# Patient Record
Sex: Female | Born: 1985 | Hispanic: No | Marital: Married | Smoking: Former smoker
Health system: Southern US, Community
[De-identification: ages and names within clinical notes are randomized; demographics above are authoritative.]

## PROBLEM LIST (undated history)

## (undated) DIAGNOSIS — J45909 Unspecified asthma, uncomplicated: Secondary | ICD-10-CM

## (undated) DIAGNOSIS — D573 Sickle-cell trait: Secondary | ICD-10-CM

## (undated) DIAGNOSIS — E079 Disorder of thyroid, unspecified: Secondary | ICD-10-CM

## (undated) DIAGNOSIS — M4800 Spinal stenosis, site unspecified: Secondary | ICD-10-CM

## (undated) DIAGNOSIS — E039 Hypothyroidism, unspecified: Secondary | ICD-10-CM

## (undated) DIAGNOSIS — Z8742 Personal history of other diseases of the female genital tract: Secondary | ICD-10-CM

## (undated) HISTORY — DX: Spinal stenosis, site unspecified: M48.00

## (undated) HISTORY — PX: NO PAST SURGERIES: SHX2092

## (undated) HISTORY — DX: Disorder of thyroid, unspecified: E07.9

## (undated) HISTORY — DX: Unspecified asthma, uncomplicated: J45.909

## (undated) HISTORY — DX: Sickle-cell trait: D57.3

## (undated) HISTORY — DX: Personal history of other diseases of the female genital tract: Z87.42

## (undated) HISTORY — DX: Hypothyroidism, unspecified: E03.9

---
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
## 2019-06-08 DIAGNOSIS — M5431 Sciatica, right side: Secondary | ICD-10-CM | POA: Insufficient documentation

## 2020-09-08 ENCOUNTER — Other Ambulatory Visit: Payer: Self-pay

## 2020-09-08 DIAGNOSIS — Z20822 Contact with and (suspected) exposure to covid-19: Secondary | ICD-10-CM

## 2020-09-10 LAB — NOVEL CORONAVIRUS, NAA: SARS-CoV-2, NAA: NOT DETECTED

## 2020-09-10 LAB — SARS-COV-2, NAA 2 DAY TAT

## 2020-11-16 ENCOUNTER — Ambulatory Visit: Payer: Self-pay | Admitting: Family Medicine

## 2020-11-21 ENCOUNTER — Ambulatory Visit: Payer: Medicaid Other | Admitting: Nurse Practitioner

## 2020-11-23 ENCOUNTER — Other Ambulatory Visit: Payer: Self-pay

## 2020-11-23 ENCOUNTER — Ambulatory Visit: Payer: Self-pay | Admitting: Nurse Practitioner

## 2020-11-23 ENCOUNTER — Ambulatory Visit (INDEPENDENT_AMBULATORY_CARE_PROVIDER_SITE_OTHER): Payer: Medicaid Other | Admitting: Nurse Practitioner

## 2020-11-23 ENCOUNTER — Encounter: Payer: Self-pay | Admitting: Nurse Practitioner

## 2020-11-23 VITALS — BP 122/72 | HR 81 | Temp 98.1°F | Ht 64.0 in | Wt 199.6 lb

## 2020-11-23 DIAGNOSIS — Z6834 Body mass index (BMI) 34.0-34.9, adult: Secondary | ICD-10-CM | POA: Diagnosis not present

## 2020-11-23 DIAGNOSIS — E039 Hypothyroidism, unspecified: Secondary | ICD-10-CM | POA: Diagnosis not present

## 2020-11-23 DIAGNOSIS — Z7689 Persons encountering health services in other specified circumstances: Secondary | ICD-10-CM | POA: Diagnosis not present

## 2020-11-23 DIAGNOSIS — E6609 Other obesity due to excess calories: Secondary | ICD-10-CM | POA: Diagnosis not present

## 2020-11-23 NOTE — Patient Instructions (Signed)
Hypothyroidism  Hypothyroidism is when the thyroid gland does not make enough of certain hormones (it is underactive). The thyroid gland is a small gland located in the lower front part of the neck, just in front of the windpipe (trachea). This gland makes hormones that help control how the body uses food for energy (metabolism) as well as how the heart and brain function. These hormones also play a role in keeping your bones strong. When the thyroid is underactive, it produces too little of the hormones thyroxine (T4) and triiodothyronine (T3). What are the causes? This condition may be caused by:  Hashimoto's disease. This is a disease in which the body's disease-fighting system (immune system) attacks the thyroid gland. This is the most common cause.  Viral infections.  Pregnancy.  Certain medicines.  Birth defects.  Past radiation treatments to the head or neck for cancer.  Past treatment with radioactive iodine.  Past exposure to radiation in the environment.  Past surgical removal of part or all of the thyroid.  Problems with a gland in the center of the brain (pituitary gland).  Lack of enough iodine in the diet. What increases the risk? You are more likely to develop this condition if:  You are female.  You have a family history of thyroid conditions.  You use a medicine called lithium.  You take medicines that affect the immune system (immunosuppressants). What are the signs or symptoms? Symptoms of this condition include:  Feeling as though you have no energy (lethargy).  Not being able to tolerate cold.  Weight gain that is not explained by a change in diet or exercise habits.  Lack of appetite.  Dry skin.  Coarse hair.  Menstrual irregularity.  Slowing of thought processes.  Constipation.  Sadness or depression. How is this diagnosed? This condition may be diagnosed based on:  Your symptoms, your medical history, and a physical exam.  Blood  tests. You may also have imaging tests, such as an ultrasound or MRI. How is this treated? This condition is treated with medicine that replaces the thyroid hormones that your body does not make. After you begin treatment, it may take several weeks for symptoms to go away. Follow these instructions at home:  Take over-the-counter and prescription medicines only as told by your health care provider.  If you start taking any new medicines, tell your health care provider.  Keep all follow-up visits as told by your health care provider. This is important. ? As your condition improves, your dosage of thyroid hormone medicine may change. ? You will need to have blood tests regularly so that your health care provider can monitor your condition. Contact a health care provider if:  Your symptoms do not get better with treatment.  You are taking thyroid hormone replacement medicine and you: ? Sweat a lot. ? Have tremors. ? Feel anxious. ? Lose weight rapidly. ? Cannot tolerate heat. ? Have emotional swings. ? Have diarrhea. ? Feel weak. Get help right away if you have:  Chest pain.  An irregular heartbeat.  A rapid heartbeat.  Difficulty breathing. Summary  Hypothyroidism is when the thyroid gland does not make enough of certain hormones (it is underactive).  When the thyroid is underactive, it produces too little of the hormones thyroxine (T4) and triiodothyronine (T3).  The most common cause is Hashimoto's disease, a disease in which the body's disease-fighting system (immune system) attacks the thyroid gland. The condition can also be caused by viral infections, medicine, pregnancy, or   past radiation treatment to the head or neck.  Symptoms may include weight gain, dry skin, constipation, feeling as though you do not have energy, and not being able to tolerate cold.  This condition is treated with medicine to replace the thyroid hormones that your body does not make. This  information is not intended to replace advice given to you by your health care provider. Make sure you discuss any questions you have with your health care provider. Document Revised: 05/20/2020 Document Reviewed: 05/05/2020 Elsevier Patient Education  2021 Elsevier Inc.  

## 2020-11-23 NOTE — Progress Notes (Signed)
This visit occurred during the SARS-CoV-2 public health emergency.  Safety protocols were in place, including screening questions prior to the visit, additional usage of staff PPE, and extensive cleaning of exam room while observing appropriate contact time as indicated for disinfecting solutions.  Subjective:     Patient ID: Terri Bright , female    DOB: 17-Aug-1986 , 35 y.o.   MRN: [id number]   Chief Complaint  Patient presents with  . Establish Care    HPI  Patient is here to establish care. She just moved from [place].  She was diagnosed with thyroid when she was 18. The medication made her feel sluggish. 2016 she was diagnosed with Hashimoto disease. She hasn't been on that medication since she had her son. She drinks wine occasionally. Smoke: No. She does not have a OBGYN specialist. She is not working currently.   She will return for a physical exam.     Past Medical History:  Diagnosis Date  . Asthma   . Sickle cell trait (HCC)   . Spinal stenosis   . Thyroid disease      Family History  Problem Relation Age of Onset  . Hypertension Mother   . Diabetes Mother   . Hypothyroidism Mother   . Seizures Father   . Diabetes Mellitus I Paternal Grandmother   . Hyperlipidemia Paternal Grandfather   . Diabetes Mellitus I Paternal Grandfather     No current outpatient medications on file.   Allergies  Allergen Reactions  . Bactrim [Sulfamethoxazole-Trimethoprim]      Review of Systems  Constitutional: Negative.  Negative for chills and fever.  HENT: Negative for ear pain.   Respiratory: Negative.  Negative for choking, shortness of breath and wheezing.   Cardiovascular: Negative.  Negative for chest pain and palpitations.  Gastrointestinal: Negative.  Negative for constipation and diarrhea.  Endocrine: Positive for cold intolerance. Negative for heat intolerance, polydipsia, polyphagia and polyuria.       History of thyroid disease   Musculoskeletal: Negative for  arthralgias and myalgias.  Neurological: Negative.  Negative for dizziness and numbness.     Today's Vitals   11/23/20 1455  BP: 122/72  Pulse: 81  Temp: 98.1 F (36.7 C)  TempSrc: Oral  Weight: 199 lb 9.6 oz (90.5 kg)  Height: 5\' 4"  (1.626 m)  PainSc: 10-Worst pain ever  PainLoc: Back   Body mass index is 34.26 kg/m.   Objective:  Physical Exam Constitutional:      Appearance: Normal appearance. She is obese.  HENT:     Head: Normocephalic and atraumatic.  Neck:     Thyroid: No thyroid mass or thyroid tenderness.  Cardiovascular:     Rate and Rhythm: Normal rate and regular rhythm.     Pulses: Normal pulses.     Heart sounds: Normal heart sounds. No murmur heard.   Pulmonary:     Effort: Pulmonary effort is normal. No respiratory distress.     Breath sounds: Normal breath sounds. No wheezing.  Musculoskeletal:     Cervical back: Normal range of motion and neck supple. No rigidity or tenderness.  Lymphadenopathy:     Cervical: No cervical adenopathy.  Skin:    General: Skin is warm and dry.     Capillary Refill: Capillary refill takes less than 2 seconds.  Neurological:     Mental Status: She is alert and oriented to person, place, and time.  Psychiatric:        Mood and Affect: Mood normal.  Behavior: Behavior normal.        Thought Content: Thought content normal.        Judgment: Judgment normal.         Assessment And Plan:     1. Encounter to establish care -Patient is here to establish care. We went over and discussed her medical, surgical and medication history. Discussed with the patient the importance of her having regular screenings, immunization and multivitamins. Patient will come back for a annual physical exam at where we will do a paps smear.   2. Hypothyroidism, unspecified type -Patient has history of hypothyroidism -will check and evaluate   - TSH + free T4  3. Class 1 obesity due to excess calories without serious comorbidity  with body mass index (BMI) of 34.0 to 34.9 in adult  -Educated patient about the importance of healthy diet and exercise. Patient should maintain a healthy diet consisting of [place] vegetables and fruits. Avoid fast food and intake of red meats. Hydrate with water and exercise for atleast 30-45 min. Daily .   Patient was given opportunity to ask questions. Patient verbalized understanding of the plan and was able to repeat key elements of the plan. All questions were answered to their satisfaction.  Bethany Cumming DNP    I, [employer], have reviewed all documentation for this visit. The documentation onfor the exam, diagnosis, procedures, and orders are all accurate and complete. 11/22/2020  IF YOU HAVE BEEN REFE0/RRED TO A SPECIALIST, IT MAY TAKE 1-2 WEEKS TO SCHEDULE/PROCESS THE REFERRAL. IF YOU HAVE NOT HEARD/ FROM US/SPECIALIST IN TWO WEEKS, PLEASE GIVE [place] A CALL AT [phone].   THE PATIENT IS ENCOURAGED TO PRACTICE SOCIAL DISTANCING DUE TO THE COVID-19 PANDEMIC.

## 2020-11-24 ENCOUNTER — Other Ambulatory Visit: Payer: Self-pay | Admitting: Nurse Practitioner

## 2020-11-24 DIAGNOSIS — E039 Hypothyroidism, unspecified: Secondary | ICD-10-CM

## 2020-11-24 DIAGNOSIS — G894 Chronic pain syndrome: Secondary | ICD-10-CM | POA: Diagnosis not present

## 2020-11-24 DIAGNOSIS — M5459 Other low back pain: Secondary | ICD-10-CM | POA: Diagnosis not present

## 2020-11-24 DIAGNOSIS — Z79899 Other long term (current) drug therapy: Secondary | ICD-10-CM | POA: Diagnosis not present

## 2020-11-24 DIAGNOSIS — M4726 Other spondylosis with radiculopathy, lumbar region: Secondary | ICD-10-CM | POA: Diagnosis not present

## 2020-11-24 DIAGNOSIS — Z79891 Long term (current) use of opiate analgesic: Secondary | ICD-10-CM | POA: Diagnosis not present

## 2020-11-24 LAB — TSH+FREE T4
Free T4: 0.94 ng/dL (ref 0.82–1.77)
TSH: 13 u[IU]/mL — ABNORMAL HIGH (ref 0.450–4.500)

## 2020-11-24 MED ORDER — LEVOTHYROXINE SODIUM 25 MCG PO TABS: 25.0000 ug | ORAL_TABLET | Freq: Every day | ORAL | 1 refills | Status: DC

## 2020-12-07 DIAGNOSIS — M4727 Other spondylosis with radiculopathy, lumbosacral region: Secondary | ICD-10-CM | POA: Diagnosis not present

## 2020-12-29 ENCOUNTER — Other Ambulatory Visit: Payer: Self-pay | Admitting: Nurse Practitioner

## 2020-12-29 DIAGNOSIS — E039 Hypothyroidism, unspecified: Secondary | ICD-10-CM

## 2021-02-23 ENCOUNTER — Encounter: Payer: Medicaid Other | Admitting: Nurse Practitioner

## 2021-02-28 ENCOUNTER — Other Ambulatory Visit (HOSPITAL_COMMUNITY)
Admission: RE | Admit: 2021-02-28 | Discharge: 2021-02-28 | Disposition: A | Discharge status: Home / Self Care | Payer: 59 | Source: Ambulatory Visit | Attending: Nurse Practitioner | Admitting: Nurse Practitioner

## 2021-02-28 ENCOUNTER — Encounter: Payer: Self-pay | Admitting: Nurse Practitioner

## 2021-02-28 ENCOUNTER — Other Ambulatory Visit: Payer: Self-pay

## 2021-02-28 ENCOUNTER — Ambulatory Visit (INDEPENDENT_AMBULATORY_CARE_PROVIDER_SITE_OTHER): Payer: 59 | Admitting: Nurse Practitioner

## 2021-02-28 VITALS — BP 112/68 | HR 70 | Temp 98.1°F | Ht 63.6 in | Wt 185.4 lb

## 2021-02-28 DIAGNOSIS — Z0001 Encounter for general adult medical examination with abnormal findings: Secondary | ICD-10-CM

## 2021-02-28 DIAGNOSIS — Z202 Contact with and (suspected) exposure to infections with a predominantly sexual mode of transmission: Secondary | ICD-10-CM | POA: Insufficient documentation

## 2021-02-28 DIAGNOSIS — Z0000 Encounter for general adult medical examination without abnormal findings: Secondary | ICD-10-CM | POA: Diagnosis not present

## 2021-02-28 DIAGNOSIS — E039 Hypothyroidism, unspecified: Secondary | ICD-10-CM

## 2021-02-28 NOTE — Patient Instructions (Signed)
Health Maintenance, Female Adopting a healthy lifestyle and getting preventive care are important in promoting health and wellness. Ask your health care provider about: The right schedule for you to have regular tests and exams. Things you can do on your own to prevent diseases and keep yourself healthy. What should I know about diet, weight, and exercise? Eat a healthy diet  Eat a diet that includes plenty of vegetables, fruits, low-fat dairy products, and lean protein. Do not eat a lot of foods that are high in solid fats, added sugars, or sodium.  Maintain a healthy weight Body mass index (BMI) is used to identify weight problems. It estimates body fat based on height and weight. Your health care provider can help determineyour BMI and help you achieve or maintain a healthy weight. Get regular exercise Get regular exercise. This is one of the most important things you can do for your health. Most adults should: Exercise for at least 150 minutes each week. The exercise should increase your heart rate and make you sweat (moderate-intensity exercise). Do strengthening exercises at least twice a week. This is in addition to the moderate-intensity exercise. Spend less time sitting. Even light physical activity can be beneficial. Watch cholesterol and blood lipids Have your blood tested for lipids and cholesterol at 33 years of age, then havethis test every 5 years. Have your cholesterol levels checked more often if: Your lipid or cholesterol levels are high. You are older than 33 years of age. You are at high risk for heart disease. What should I know about cancer screening? Depending on your health history and family history, you may need to have cancer screening at various ages. This may include screening for: Breast cancer. Cervical cancer. Colorectal cancer. Skin cancer. Lung cancer. What should I know about heart disease, diabetes, and high blood pressure? Blood pressure and heart  disease High blood pressure causes heart disease and increases the risk of stroke. This is more likely to develop in people who have high blood pressure readings, are of African descent, or are overweight. Have your blood pressure checked: Every 3-5 years if you are 18-39 years of age. Every year if you are 40 years old or older. Diabetes Have regular diabetes screenings. This checks your fasting blood sugar level. Have the screening done: Once every three years after age 40 if you are at a normal weight and have a low risk for diabetes. More often and at a younger age if you are overweight or have a high risk for diabetes. What should I know about preventing infection? Hepatitis B If you have a higher risk for hepatitis B, you should be screened for this virus. Talk with your health care provider to find out if you are at risk forhepatitis B infection. Hepatitis C Testing is recommended for: Everyone born from 1945 through 1965. Anyone with known risk factors for hepatitis C. Sexually transmitted infections (STIs) Get screened for STIs, including gonorrhea and chlamydia, if: You are sexually active and are younger than 33 years of age. You are older than 33 years of age and your health care provider tells you that you are at risk for this type of infection. Your sexual activity has changed since you were last screened, and you are at increased risk for chlamydia or gonorrhea. Ask your health care provider if you are at risk. Ask your health care provider about whether you are at high risk for HIV. Your health care provider may recommend a prescription medicine to help   prevent HIV infection. If you choose to take medicine to prevent HIV, you should first get tested for HIV. You should then be tested every 3 months for as long as you are taking the medicine. Pregnancy If you are about to stop having your period (premenopausal) and you may become pregnant, seek counseling before you get  pregnant. Take 400 to 800 micrograms (mcg) of folic acid every day if you become pregnant. Ask for birth control (contraception) if you want to prevent pregnancy. Osteoporosis and menopause Osteoporosis is a disease in which the bones lose minerals and strength with aging. This can result in bone fractures. If you are 65 years old or older, or if you are at risk for osteoporosis and fractures, ask your health care provider if you should: Be screened for bone loss. Take a calcium or vitamin D supplement to lower your risk of fractures. Be given hormone replacement therapy (HRT) to treat symptoms of menopause. Follow these instructions at home: Lifestyle Do not use any products that contain nicotine or tobacco, such as cigarettes, e-cigarettes, and chewing tobacco. If you need help quitting, ask your health care provider. Do not use street drugs. Do not share needles. Ask your health care provider for help if you need support or information about quitting drugs. Alcohol use Do not drink alcohol if: Your health care provider tells you not to drink. You are pregnant, may be pregnant, or are planning to become pregnant. If you drink alcohol: Limit how much you use to 0-1 drink a day. Limit intake if you are breastfeeding. Be aware of how much alcohol is in your drink. In the [place] one drink equals one 12 oz bottle of beer (355 mL), one 5 oz glass of wine (148 mL), or one 1 oz glass of hard liquor (44 mL). General instructions Schedule regular health, dental, and eye exams. Stay current with your vaccines. Tell your health care provider if: You often feel depressed. You have ever been abused or do not feel safe at home. Summary Adopting a healthy lifestyle and getting preventive care are important in promoting health and wellness. Follow your health care provider's instructions about healthy diet, exercising, and getting tested or screened for diseases. Follow your health care provider's  instructions on monitoring your cholesterol and blood pressure. This information is not intended to replace advice given to you by your health care provider. Make sure you discuss any questions you have with your healthcare provider. Document Revised: 08/13/2018 Document Reviewed: 08/13/2018 Elsevier Patient Education  2022 Elsevier Inc.  

## 2021-02-28 NOTE — Progress Notes (Signed)
I,Tianna Badgett,acting as a Education administrator for [employer], NP.,have documented all relevant documentation on the behalf of [employer], NP,as directed by  Bary Castilla, NP while in the presence of Bary Castilla, NP.  This visit occurred during the SARS-CoV-2 public health emergency.  Safety protocols were in place, including screening questions prior to the visit, additional usage of staff PPE, and extensive cleaning of exam room while observing appropriate contact time as indicated for disinfecting solutions.  Subjective:     Patient ID: Terri Bright , female    DOB: 19-Apr-1986 , 35 y.o.   MRN: [id number]   Chief Complaint  Patient presents with   Annual Exam    HPI  Patient is here for physical exam.  Diet: breakfast majority of the time. She eats pretty healthy. Lots of vegetables. Roasted chicken with corn. Potatoes and carrots. Drinks a lot of water.  Exercise: She stays active at work.  Drink: Occasionally  Smoke: no  Sexually active and has one child.     Past Medical History:  Diagnosis Date   Asthma    Sickle cell trait ([place])    Spinal stenosis    Thyroid disease      Family History  Problem Relation Age of Onset   Hypertension Mother    Diabetes Mother    Hypothyroidism Mother    Seizures Father    Diabetes Mellitus I Paternal Grandmother    Hyperlipidemia Paternal Grandfather    Diabetes Mellitus I Paternal Grandfather      Current Outpatient Medications:    levothyroxine (SYNTHROID) 25 MCG tablet, TAKE 1 TABLET(25 MCG) BY MOUTH DAILY BEFORE BREAKFAST, Disp: 30 tablet, Rfl: 1   Allergies  Allergen Reactions   Bactrim [Sulfamethoxazole-Trimethoprim]       The patient states she uses none for birth control. Last LMP was No LMP recorded.. Negative for Dysmenorrhea. Negative for: breast discharge, breast lump(s), breast pain and breast self exam. Associated symptoms include abnormal vaginal bleeding. Pertinent negatives include abnormal  bleeding (hematology), anxiety, decreased libido, depression, difficulty falling sleep, dyspareunia, history of infertility, nocturia, sexual dysfunction, sleep disturbances, urinary incontinence, urinary urgency, vaginal discharge and vaginal itching. Diet regular.The patient states her exercise level is    . The patient's tobacco use is:  Social History   Tobacco Use  Smoking Status Former   Pack years: 0.00  Smokeless Tobacco Never  . She has been exposed to passive smoke. The patient's alcohol use is:  Social History   Substance and Sexual Activity  Alcohol Use Yes   Comment: ocassionally  . Additional information: Last pap  next one scheduled for patient currently on her period and would like to go see a OBGYN  Review of Systems  Constitutional: Negative.  Negative for chills, fatigue and fever.  HENT: Negative.  Negative for congestion, rhinorrhea, sinus pressure, sinus pain and sneezing.   Eyes: Negative.   Respiratory: Negative.  Negative for cough, chest tightness, shortness of breath and wheezing.   Cardiovascular: Negative.  Negative for chest pain and palpitations.  Gastrointestinal: Negative.   Endocrine: Negative.   Genitourinary: Negative.  Negative for dysuria.  Musculoskeletal: Negative.  Negative for arthralgias.  Skin: Negative.   Allergic/Immunologic: Negative.   Neurological: Negative.  Negative for dizziness, weakness, numbness and headaches.  Hematological: Negative.   Psychiatric/Behavioral: Negative.      Today's Vitals   02/28/21 1502  BP: 112/68  Pulse: 70  Temp: 98.1 F (36.7 C)  TempSrc: Oral  Weight: 185 lb 6.4 oz (84.1  kg)  Height: 5' 3.6" (1.615 m)   Body mass index is 32.23 kg/m.   Objective:  Physical Exam Vitals and nursing note reviewed.  Constitutional:      Appearance: Normal appearance.  HENT:     Head: Normocephalic and atraumatic.     Right Ear: Tympanic membrane, ear canal and external ear normal.     Left Ear: Tympanic  membrane, ear canal and external ear normal.     Nose: Nose normal.     Mouth/Throat:     Mouth: Mucous membranes are moist.     Pharynx: Oropharynx is clear.  Eyes:     Extraocular Movements: Extraocular movements intact.     Conjunctiva/sclera: Conjunctivae normal.     Pupils: Pupils are equal, round, and reactive to light.  Cardiovascular:     Rate and Rhythm: Normal rate and regular rhythm.     Pulses: Normal pulses.     Heart sounds: Normal heart sounds. No murmur heard. Pulmonary:     Effort: Pulmonary effort is normal. No respiratory distress.     Breath sounds: Normal breath sounds. No wheezing.  Abdominal:     General: Abdomen is flat. Bowel sounds are normal.     Palpations: Abdomen is soft.  Genitourinary:    Comments: Deferred. Patient refused. She is on menstruation.  Musculoskeletal:        General: Normal range of motion.     Cervical back: Normal range of motion and neck supple.  Skin:    General: Skin is warm and dry.     Capillary Refill: Capillary refill takes less than 2 seconds.  Neurological:     General: No focal deficit present.     Mental Status: She is alert and oriented to person, place, and time.  Psychiatric:        Mood and Affect: Mood normal.        Behavior: Behavior normal.        Assessment And Plan:     1. Encounter for annual physical exam --Patient is here for their annual physical exam and we discussed any changes to medication and medical history.  -Behavior modification was discussed as well as diet and exercise history  -Patient will continue to exercise regularly and modify their diet.  -Recommendation for yearly physical annuals, immunization and screenings including mammogram and colonoscopy were discussed with the patient.  -Recommended intake of multivitamin, vitamin D and calcium.  -Individualized advise was given to the patient pertaining to their own health history in regards to diet, exercise, medical condition and  referrals.  - CBC - Hemoglobin A1c - CMP14+EGFR - Lipid panel - Hepatitis C antibody - HIV Antibody (routine testing w rflx)  2. Hypothyroidism, unspecified type -Patient currently taking med.  -Will check and assess and change med if needed.  - TSH + free T4  3. Possible exposure to STD -possible exposure to STD and will do STD testing  - STD Panel - Urine cytology ancillary only - RPR - Ambulatory referral to Obstetrics / Gynecology  The patient was encouraged to call or send a message through [place] for any questions or concerns.   Follow up: if symptoms persist or do not get better.   Side effects and appropriate use of all the medication(s) were discussed with the patient today. Patient advised to use the medication(s) as directed by their healthcare provider. The patient was encouraged to read, review, and understand all associated package inserts and contact our office with any questions  or concerns. The patient accepts the risks of the treatment plan and had an opportunity to ask questions.   Patient was given opportunity to ask questions. Patient verbalized understanding of the plan and was able to repeat key elements of the plan. All questions were answered to their satisfaction.  Raman Shemeca Lukasik, DNP   I, Raman Quantina Dershem have reviewed all documentation for this visit. The documentation on 02/28/21 for the exam, diagnosis, procedures, and orders are all accurate and complete.   THE PATIENT IS ENCOURAGED TO PRACTICE SOCIAL DISTANCING DUE TO THE COVID-19 PANDEMIC.

## 2021-03-02 LAB — RPR+HSVIGM+HBSAG+HSV2(IGG)+...
HIV Screen 4th Generation wRfx: NONREACTIVE
HSV 2 IgG, Type Spec: <0.91 index (ref 0.00–0.90)
HSVI/II Comb IgM: 1.95 Ratio — ABNORMAL HIGH (ref 0.00–0.90)
Hepatitis B Surface Ag: NEGATIVE
RPR Ser Ql: NONREACTIVE

## 2021-03-02 LAB — CMP14+EGFR
ALT: 50 IU/L — ABNORMAL HIGH (ref 0–32)
AST: 27 IU/L (ref 0–40)
Albumin/Globulin Ratio: 1.5 (ref 1.2–2.2)
Albumin: 4.8 g/dL (ref 3.8–4.8)
Alkaline Phosphatase: 72 IU/L (ref 44–121)
BUN/Creatinine Ratio: 19 (ref 9–23)
BUN: 12 mg/dL (ref 6–20)
Bilirubin Total: 0.6 mg/dL (ref 0.0–1.2)
CO2: 19 mmol/L — ABNORMAL LOW (ref 20–29)
Calcium: 9.8 mg/dL (ref 8.7–10.2)
Chloride: 103 mmol/L (ref 96–106)
Creatinine, Ser: 0.64 mg/dL (ref 0.57–1.00)
Globulin, Total: 3.2 g/dL (ref 1.5–4.5)
Glucose: 84 mg/dL (ref 65–99)
Potassium: 4.3 mmol/L (ref 3.5–5.2)
Sodium: 141 mmol/L (ref 134–144)
Total Protein: 8 g/dL (ref 6.0–8.5)
eGFR: 120 mL/min/{1.73_m2} (ref >59)

## 2021-03-02 LAB — CBC
Hematocrit: 36.8 % (ref 34.0–46.6)
Hemoglobin: 12.4 g/dL (ref 11.1–15.9)
MCH: 25.4 pg — ABNORMAL LOW (ref 26.6–33.0)
MCHC: 33.7 g/dL (ref 31.5–35.7)
MCV: 75 fL — ABNORMAL LOW (ref 79–97)
Platelets: 397 10*3/uL (ref 150–450)
RBC: 4.88 x10E6/uL (ref 3.77–5.28)
RDW: 12.5 % (ref 11.7–15.4)
WBC: 6 10*3/uL (ref 3.4–10.8)

## 2021-03-02 LAB — HEPATITIS C ANTIBODY: Hep C Virus Ab: <0.1 s/co ratio (ref 0.0–0.9)

## 2021-03-02 LAB — URINE CYTOLOGY ANCILLARY ONLY
Bacterial Vaginitis-Urine: NEGATIVE
Candida Urine: NEGATIVE
Chlamydia: NEGATIVE
Comment: NEGATIVE
Comment: NEGATIVE
Comment: NORMAL
Neisseria Gonorrhea: NEGATIVE
Trichomonas: NEGATIVE

## 2021-03-02 LAB — TSH+FREE T4
Free T4: 1.19 ng/dL (ref 0.82–1.77)
TSH: 8.54 u[IU]/mL — ABNORMAL HIGH (ref 0.450–4.500)

## 2021-03-02 LAB — LIPID PANEL
Chol/HDL Ratio: 7.1 ratio — ABNORMAL HIGH (ref 0.0–4.4)
Cholesterol, Total: 235 mg/dL — ABNORMAL HIGH (ref 100–199)
HDL: 33 mg/dL — ABNORMAL LOW (ref >39)
LDL Chol Calc (NIH): 167 mg/dL — ABNORMAL HIGH (ref 0–99)
Triglycerides: 187 mg/dL — ABNORMAL HIGH (ref 0–149)
VLDL Cholesterol Cal: 35 mg/dL (ref 5–40)

## 2021-03-02 LAB — HEMOGLOBIN A1C
Est. average glucose Bld gHb Est-mCnc: 108 mg/dL
Hgb A1c MFr Bld: 5.4 % (ref 4.8–5.6)

## 2021-03-07 ENCOUNTER — Other Ambulatory Visit: Payer: Self-pay | Admitting: Nurse Practitioner

## 2021-03-07 DIAGNOSIS — B009 Herpesviral infection, unspecified: Secondary | ICD-10-CM

## 2021-03-07 DIAGNOSIS — E039 Hypothyroidism, unspecified: Secondary | ICD-10-CM

## 2021-03-07 MED ORDER — VALACYCLOVIR HCL 500 MG PO TABS: 500.0000 mg | ORAL_TABLET | Freq: Two times a day (BID) | ORAL | 0 refills | Status: AC

## 2021-03-07 MED ORDER — LEVOTHYROXINE SODIUM 50 MCG PO TABS: 50.0000 ug | ORAL_TABLET | Freq: Every day | ORAL | 2 refills | Status: DC

## 2021-04-04 ENCOUNTER — Ambulatory Visit: Payer: Medicaid Other | Admitting: Nurse Practitioner

## 2021-04-11 ENCOUNTER — Ambulatory Visit: Payer: Medicaid Other | Admitting: Nurse Practitioner

## 2021-04-18 ENCOUNTER — Other Ambulatory Visit: Payer: Medicaid Other

## 2021-04-24 ENCOUNTER — Other Ambulatory Visit: Payer: Self-pay

## 2021-04-24 ENCOUNTER — Ambulatory Visit (INDEPENDENT_AMBULATORY_CARE_PROVIDER_SITE_OTHER): Payer: 59 | Admitting: Advanced Practice Midwife

## 2021-04-24 ENCOUNTER — Encounter: Payer: Self-pay | Admitting: Advanced Practice Midwife

## 2021-04-24 VITALS — BP 118/76 | HR 83 | Wt 190.0 lb

## 2021-04-24 DIAGNOSIS — E063 Autoimmune thyroiditis: Secondary | ICD-10-CM | POA: Insufficient documentation

## 2021-04-24 DIAGNOSIS — N939 Abnormal uterine and vaginal bleeding, unspecified: Secondary | ICD-10-CM | POA: Insufficient documentation

## 2021-04-24 DIAGNOSIS — Z3169 Encounter for other general counseling and advice on procreation: Secondary | ICD-10-CM | POA: Diagnosis not present

## 2021-04-24 DIAGNOSIS — Z01419 Encounter for gynecological examination (general) (routine) without abnormal findings: Secondary | ICD-10-CM | POA: Diagnosis not present

## 2021-04-24 NOTE — Progress Notes (Signed)
Subjective:     Terri Bright is a 35 y.o. female here at [facility] for a routine exam.  Current complaints: none, irregular menses but this is pt baseline. She had regular periods for 1 year or so after her son but has been irregular otherwise since menarche.  She desires another pregnancy. She used Femara, an ovulation stimulating medication, to become pregnant with her 33 year old and desires to try this again.  She has Hashimotos Disease and has on and off taken medications for this. She reports significant side effects with her recent restart of thyroid medication so she stopped taking it.  Personal health questionnaire reviewed: yes.    Do you have a primary care provider? yes Do you feel safe at home? yes  Flowsheet Row Office Visit from 11/23/2020 in [facility]  PHQ-2 Total Score 0       Health Maintenance Due  Topic Date Due   PAP SMEAR-Modifier  Never done   COVID-19 Vaccine (3 - Booster for Moderna series) 06/03/2020   INFLUENZA VACCINE  04/03/2021     Risk factors for chronic health problems: Smoking: Former smoker Alchohol/how much: occasional Pt BMI: Body mass index is 33.02 kg/m.   Gynecologic History Patient's last menstrual period was 04/14/2021. Contraception: none Last Pap: 2021. Results were: normal with neg HPV per pt, records requested. Last mammogram: n/a. Results were: normal  Obstetric History OB History  Gravida Para Term Preterm AB Living  1 1 1         SAB IAB Ectopic Multiple Live Births               # Outcome Date GA Lbr Len/2nd Weight Sex Delivery Anes PTL Lv  1 Term 01/05/16       N      The following portions of the patient's history were reviewed and updated as appropriate: allergies, current medications, past family history, past medical history, past social history, past surgical history, and problem list.  Review of Systems Pertinent items noted in HPI and remainder of comprehensive ROS otherwise negative.     Objective:   BP 118/76   Pulse 83   Wt 190 lb (86.2 kg)   LMP 04/14/2021   BMI 33.02 kg/m  VS reviewed, nursing note reviewed,  Constitutional: well developed, well nourished, no distress HEENT: normocephalic CV: normal rate Pulm/chest wall: normal effort Breast Exam:  Deferred with low risks and shared decision making, discussed recommendation to start mammogram between 40-50 yo/ Abdomen: soft Neuro: alert and oriented x 3 Skin: warm, dry Psych: affect normal Pelvic exam: Deferred      Assessment/Plan:  1. Well woman exam with routine gynecological exam --Pap 1 year ago wnl with neg HPV per pt --Records requested  2. Encounter for preconception consultation --Reviewed healthy lifestyle and diet, pt is eating healthy/low carb currently --Pt was able to become pregnant in 3 months with ovulation stimulating med (Femara) with her 33 year old and desires to try this again  3. Abnormal uterine bleeding (AUB) --Irregular menses since menarche, oligomenorrhea when younger, now menses 2 times per month --C/W PCOS, no other reported symptoms but GDM in previous pregnancy --Discussed options including OCPs/hormonal tx for bleeding. Pt declines. She used Femara for ovulation and became pregnant in 3 months with her 33 year old and desires to try this again.      Follow up in: 1 month with MD to discuss fertility options then 1 year for annual exam or  as needed.   Sharen Counter, CNM 2:05 PM

## 2021-05-02 ENCOUNTER — Ambulatory Visit: Payer: Medicaid Other | Admitting: Nurse Practitioner

## 2021-05-22 ENCOUNTER — Encounter: Payer: Self-pay | Admitting: Obstetrics and Gynecology

## 2021-05-22 ENCOUNTER — Ambulatory Visit (INDEPENDENT_AMBULATORY_CARE_PROVIDER_SITE_OTHER): Payer: 59 | Admitting: Obstetrics and Gynecology

## 2021-05-22 ENCOUNTER — Other Ambulatory Visit: Payer: Self-pay

## 2021-05-22 ENCOUNTER — Ambulatory Visit: Payer: Medicaid Other | Admitting: Advanced Practice Midwife

## 2021-05-22 ENCOUNTER — Other Ambulatory Visit (HOSPITAL_COMMUNITY)
Admission: RE | Admit: 2021-05-22 | Discharge: 2021-05-22 | Disposition: A | Discharge status: Home / Self Care | Payer: 59 | Source: Ambulatory Visit | Attending: Advanced Practice Midwife | Admitting: Advanced Practice Midwife

## 2021-05-22 VITALS — BP 131/79 | HR 86

## 2021-05-22 DIAGNOSIS — N939 Abnormal uterine and vaginal bleeding, unspecified: Secondary | ICD-10-CM | POA: Diagnosis not present

## 2021-05-22 NOTE — Progress Notes (Signed)
35 yo P1 presenting today for the evaluation of abnormal uterine bleeding. Patient reports onset of twice monthly 6-7 day cycle since February. Patient states she is having a predictable period every 2 weeks. She denies any pelvic pain or abnormal discharge. Patient states she was diagnosed with PCOS and admits to a history of oligomenorrhea. She states she has never had a cycle this frequently. Patient is also interested in conceiving and required assistance with Femara. Patient desires Femara again  Past Medical History:  Diagnosis Date   Asthma    Hypothyroidism    Sickle cell trait (HCC)    Spinal stenosis    Thyroid disease    History reviewed. No pertinent surgical history. Family History  Problem Relation Age of Onset   Hypertension Mother    Diabetes Mother    Hypothyroidism Mother    Seizures Father    Diabetes Mellitus I Paternal Grandmother    Hyperlipidemia Paternal Grandfather    Diabetes Mellitus I Paternal Grandfather    Social History   Tobacco Use   Smoking status: Former   Smokeless tobacco: Never  Substance Use Topics   Alcohol use: Yes    Comment: ocassionally   Drug use: Never   ROS See pertinent in HPI. All other systems reviewed and non contributory  Blood pressure 131/79, pulse 86, last menstrual period 05/07/2021. GENERAL: Well-developed, well-nourished female in no acute distress.  ABDOMEN: Soft, nontender, nondistended. No organomegaly. PELVIC: Normal external female genitalia. Vagina is pink and rugated.  Normal discharge. Normal appearing cervix. Uterus is normal in size. No adnexal mass or tenderness. Chaperone present during the pelvic exam EXTREMITIES: No cyanosis, clubbing, or edema, 2+ distal pulses.  A/P 36 yo with PCOS and abnormal uterine bleeding - Pelvic ultrasound ordered - Discussed benefits of endometrial biopsy ENDOMETRIAL BIOPSY     The indications for endometrial biopsy were reviewed.   Risks of the biopsy including cramping,  bleeding, infection, uterine perforation, inadequate specimen and need for additional procedures  were discussed. The patient states she understands and agrees to undergo procedure today. Consent was signed. Time out was performed. Urine HCG was negative. A sterile speculum was placed in the patient's vagina and the cervix was prepped with Betadine. A single-toothed tenaculum was placed on the anterior lip of the cervix to stabilize it. The uterine cavity was sounded to a depth of 8 cm using the uterine sound. The 3 mm pipelle was introduced into the endometrial cavity without difficulty, 2passes were made.  A  moderate amount of tissue was  sent to pathology. The instruments were removed from the patient's vagina. Minimal bleeding from the cervix was noted. The patient tolerated the procedure well.  Routine post-procedure instructions were given to the patient. The patient will follow up in two weeks to review the results and for further management.  - Rx Femara and provera pending completion of the evaluation for AUB

## 2021-05-23 LAB — SURGICAL PATHOLOGY

## 2021-05-30 ENCOUNTER — Ambulatory Visit: Payer: Medicaid Other

## 2021-06-07 ENCOUNTER — Ambulatory Visit: Admission: RE | Admit: 2021-06-07 | Payer: 59 | Source: Ambulatory Visit

## 2021-06-12 ENCOUNTER — Ambulatory Visit (HOSPITAL_BASED_OUTPATIENT_CLINIC_OR_DEPARTMENT_OTHER): Payer: 59

## 2021-06-13 ENCOUNTER — Ambulatory Visit (HOSPITAL_BASED_OUTPATIENT_CLINIC_OR_DEPARTMENT_OTHER)
Admission: RE | Admit: 2021-06-13 | Discharge: 2021-06-13 | Disposition: A | Discharge status: Home / Self Care | Payer: 59 | Source: Ambulatory Visit | Attending: Obstetrics and Gynecology | Admitting: Obstetrics and Gynecology

## 2021-06-13 ENCOUNTER — Other Ambulatory Visit: Payer: Self-pay

## 2021-06-13 DIAGNOSIS — N939 Abnormal uterine and vaginal bleeding, unspecified: Secondary | ICD-10-CM | POA: Diagnosis not present

## 2021-06-14 ENCOUNTER — Other Ambulatory Visit: Payer: Self-pay | Admitting: Obstetrics and Gynecology

## 2021-06-14 MED ORDER — LETROZOLE 2.5 MG PO TABS: 2.5000 mg | ORAL_TABLET | Freq: Every day | ORAL | 2 refills | Status: DC

## 2021-06-14 MED ORDER — MEDROXYPROGESTERONE ACETATE 10 MG PO TABS
10.0000 mg | ORAL_TABLET | Freq: Every day | ORAL | 2 refills | Status: DC
Start: 2021-06-14 — End: 2021-11-03

## 2021-06-17 ENCOUNTER — Telehealth: Payer: Self-pay | Admitting: Advanced Practice Midwife

## 2021-06-19 NOTE — Telephone Encounter (Signed)
Message sent to pt to obtain Pap records from previous provider.

## 2021-08-16 ENCOUNTER — Ambulatory Visit: Payer: Medicaid Other | Admitting: Nurse Practitioner

## 2021-11-03 ENCOUNTER — Telehealth: Payer: Medicaid Other | Admitting: Physician Assistant

## 2021-11-03 DIAGNOSIS — B349 Viral infection, unspecified: Secondary | ICD-10-CM | POA: Diagnosis not present

## 2021-11-03 DIAGNOSIS — R6889 Other general symptoms and signs: Secondary | ICD-10-CM

## 2021-11-03 DIAGNOSIS — R197 Diarrhea, unspecified: Secondary | ICD-10-CM

## 2021-11-03 NOTE — Progress Notes (Signed)
Based on what you shared with me, I feel your condition warrants further evaluation and I recommend that you be seen in a face to face visit. ? ?Giving note of constant pain in chest or abdomen you will require an in-person evaluation for detailed examination and workup so you can get the proper treatment. Please be seen at local Urgent Care if you PCP is unable to evaluate you today.  ?  ?NOTE: There will be NO CHARGE for this eVisit ?  ?If you are having a true medical emergency please call 911.   ?  ? For an urgent face to face visit, [facility] has six urgent care centers for your convenience:  ?  ? [facility] ?Get Driving Directions ?[phone] ?[phone] ?[place], [place] [address] ?  ? [facility]) ?Get Driving Directions ?[phone] ?[address] ?[place], [place] [address] ? ?[facility]) ?Get Driving Directions ?[phone] ?[address] ?[place],  [place]  [address] ? ?[facility] ?Get Driving Directions ?[phone] ?[phone] ?[place], [place] [address] ?  ?[facility] [place] ?Get Driving Directions  ?[phone] ?[address] ?[address] ?[place], [place] [address] ?  ?[facility] ?Get Driving Directions ?[phone] ?[address] ?[place], [place] [address] ? ?Your MyChart E-visit questionnaire answers were reviewed by a board certified advanced clinical practitioner to complete your personal care plan based on your specific symptoms.  Thank you for using e-Visits. ?  ? ?

## 2021-11-03 NOTE — Patient Instructions (Addendum)
?  Terri Bright, thank you for joining [name], PA-C for today's virtual visit.  While this provider is not your primary care provider (PCP), if your PCP is located in our provider database this encounter information will be shared with them immediately following your visit. ? ?Consent: ?(Patient) [name] provided verbal consent for this virtual visit at the beginning of the encounter. ? ?Current Medications: ?No current outpatient medications on file.  ? ?Medications ordered in this encounter:  ?No orders of the defined types were placed in this encounter. ?  ? ?*If you need refills on other medications prior to your next appointment, please contact your pharmacy* ? ?Follow-Up: ?Call back or seek an in-person evaluation if the symptoms worsen or if the condition fails to improve as anticipated. ? ?Other Instructions ?Please keep well-hydrated and get plenty of rest. ?Try to follow the BRAT diet below. Start a daily probiotic. ?Try to get some low-sugar electrolyte water in your system like G2 gatorade or Pedialyte. ?Use the zofran as directed for nausea. You can use Imodium OTC as well. ? ?If not resolving or any new or worsening symptoms despite treatment, you need to seek an in-person evaluation as discussed.  ? ? ?If you have been instructed to have an in-person evaluation today at a local Urgent Care facility, please use the link below. It will take you to a list of all of our available [facility], including address, phone number and hours of operation. Please do not delay care.  ?[facility] ? ?If you or a family member do not have a primary care provider, use the link below to schedule a visit and establish care. When you choose a [facility] primary care physician or advanced practice provider, you gain a long-term partner in health. ?Find a Primary Care Provider ? ?Learn more about [facility]'s in-office and virtual care options: ?[facility] Now  ?

## 2021-11-03 NOTE — Progress Notes (Signed)
?Virtual Visit Consent  ? ?Terri Bright, you are scheduled for a virtual visit with a [facility] provider today.   ?  ?Just as with appointments in the office, your consent must be obtained to participate.  Your consent will be active for this visit and any virtual visit you may have with one of our providers in the next 365 days.   ?  ?If you have a MyChart account, a copy of this consent can be sent to you electronically.  All virtual visits are billed to your insurance company just like a traditional visit in the office.   ? ?As this is a virtual visit, video technology does not allow for your provider to perform a traditional examination.  This may limit your provider's ability to fully assess your condition.  If your provider identifies any concerns that need to be evaluated in person or the need to arrange testing (such as labs, EKG, etc.), we will make arrangements to do so.   ?  ?Although advances in technology are sophisticated, we cannot ensure that it will always work on either your end or our end.  If the connection with a video visit is poor, the visit may have to be switched to a telephone visit.  With either a video or telephone visit, we are not always able to ensure that we have a secure connection.    ? ?I need to obtain your verbal consent now.   Are you willing to proceed with your visit today?  ?  ?Terri Bright has provided verbal consent on 11/03/2021 for a virtual visit (video or telephone). ?  ?Piedad Climes, PA-C  ? ?Date: 11/03/2021 2:19 PM ? ? ?Virtual Visit via Video Note  ? ?IPiedad Climes, connected with  Terri Bright  ([id number], 1985-12-07) on 11/03/21 at  2:15 PM EST by a video-enabled telemedicine application and verified that I am speaking with the correct person using two identifiers. ? ?Location: ?Patient: Virtual Visit Location Patient: Home ?Provider: Virtual Visit Location Provider: Home Office ?  ?I discussed the limitations of evaluation and management by  telemedicine and the availability of in person appointments. The patient expressed understanding and agreed to proceed.   ? ?History of Present Illness: ?Terri Bright is a 36 y.o. who identifies as a female who was assigned female at birth, and is being seen today for symptoms starting Monday night. Notes initially with sore throat. The next morning noting worsened sore throat and development of some mild aches and chills Notes she gargled with salt water for sore throat. As of yesterday noting some loose stool with urgency and episodes of nausea and vomiting. Last episode of emesis was last night. Noting low-grade fever until today and has resolved. Still with diarrhea and headache. Denies recent travel. Took a home COVID test which was negative.  ? ? ?HPI: HPI  ?Problems:  ?Patient Active Problem List  ? Diagnosis Date Noted  ? Hashimoto's disease 04/24/2021  ? Abnormal uterine bleeding (AUB) 04/24/2021  ?  ?Allergies:  ?Allergies  ?Allergen Reactions  ? Bactrim [Sulfamethoxazole-Trimethoprim]   ? ?Medications: No current outpatient medications on file. ? ?Observations/Objective: ?Patient is well-developed, well-nourished in no acute distress.  ?Resting comfortably at home.  ?Head is normocephalic, atraumatic.  ?No labored breathing. ?Speech is clear and coherent with logical content.  ?Patient is alert and oriented at baseline.  ? ?Assessment and Plan: ?1. Viral illness ? ?Question initial flu-like illness versus norovirus. URI symptoms have mostly resolved as  well as fever. Still with nausea and diarrhea. Emesis stopped as of last night. Will continue to push PO fluids. Rx Zofran. Imodium OTC as needed. BRAT diet. Strict UC/ER precautions reviewed.  ? ?Follow Up Instructions: ?I discussed the assessment and treatment plan with the patient. The patient was provided an opportunity to ask questions and all were answered. The patient agreed with the plan and demonstrated an understanding of the instructions.  A copy  of instructions were sent to the patient via MyChart unless otherwise noted below.  ? ?The patient was advised to call back or seek an in-person evaluation if the symptoms worsen or if the condition fails to improve as anticipated. ? ?Time:  ?I spent 12 minutes with the patient via telehealth technology discussing the above problems/concerns.   ? ?Piedad Climes, PA-C ?

## 2022-02-20 ENCOUNTER — Ambulatory Visit (INDEPENDENT_AMBULATORY_CARE_PROVIDER_SITE_OTHER): Payer: Medicaid Other

## 2022-02-20 VITALS — BP 119/81 | HR 80 | Ht 64.0 in | Wt 192.0 lb

## 2022-02-20 DIAGNOSIS — Z3201 Encounter for pregnancy test, result positive: Secondary | ICD-10-CM

## 2022-02-20 DIAGNOSIS — O0990 Supervision of high risk pregnancy, unspecified, unspecified trimester: Secondary | ICD-10-CM

## 2022-02-20 LAB — POCT URINE PREGNANCY: Preg Test, Ur: POSITIVE — AB

## 2022-02-20 NOTE — Progress Notes (Addendum)
Ms. Eber presents today for UPT. She has no unusual complaints.  [id number]    OBJECTIVE: Appears well, in no apparent distress.  OB History     Gravida  2   Para  1   Term  1   Preterm      AB      Living  1      SAB      IAB      Ectopic      Multiple      Live Births             Home UPT Result: POSITIVE X3 In-Office UPT result: POSITIVE  I have reviewed the patient's medical, obstetrical, social, and family histories, and medications.   ASSESSMENT: Positive pregnancy test LMP 01/11/2022 EDD 10/18/2022 [place]    [redacted]w[redacted]d  PLAN Prenatal care to be completed at: [facility]

## 2022-03-07 ENCOUNTER — Encounter: Payer: Medicaid Other | Admitting: Nurse Practitioner

## 2022-03-12 ENCOUNTER — Telehealth: Payer: Self-pay

## 2022-03-19 ENCOUNTER — Inpatient Hospital Stay (HOSPITAL_COMMUNITY): Payer: Medicaid Other

## 2022-03-19 ENCOUNTER — Inpatient Hospital Stay (HOSPITAL_COMMUNITY)
Admission: AD | Admit: 2022-03-19 | Discharge: 2022-03-19 | Disposition: A | Discharge status: Home / Self Care | Payer: Medicaid Other | Attending: Obstetrics & Gynecology | Admitting: Obstetrics & Gynecology

## 2022-03-19 ENCOUNTER — Ambulatory Visit (INDEPENDENT_AMBULATORY_CARE_PROVIDER_SITE_OTHER): Payer: Medicaid Other

## 2022-03-19 ENCOUNTER — Ambulatory Visit (INDEPENDENT_AMBULATORY_CARE_PROVIDER_SITE_OTHER): Payer: Medicaid Other | Admitting: Medical

## 2022-03-19 VITALS — BP 119/76 | HR 91 | Ht 64.0 in | Wt 195.2 lb

## 2022-03-19 DIAGNOSIS — O021 Missed abortion: Secondary | ICD-10-CM | POA: Diagnosis not present

## 2022-03-19 DIAGNOSIS — R109 Unspecified abdominal pain: Secondary | ICD-10-CM | POA: Diagnosis not present

## 2022-03-19 DIAGNOSIS — O0990 Supervision of high risk pregnancy, unspecified, unspecified trimester: Secondary | ICD-10-CM

## 2022-03-19 DIAGNOSIS — O0289 Other abnormal products of conception: Secondary | ICD-10-CM

## 2022-03-19 DIAGNOSIS — O3680X0 Pregnancy with inconclusive fetal viability, not applicable or unspecified: Secondary | ICD-10-CM

## 2022-03-19 DIAGNOSIS — O26891 Other specified pregnancy related conditions, first trimester: Secondary | ICD-10-CM | POA: Insufficient documentation

## 2022-03-19 DIAGNOSIS — Z3A09 9 weeks gestation of pregnancy: Secondary | ICD-10-CM | POA: Insufficient documentation

## 2022-03-19 LAB — CBC
HCT: 36.3 % (ref 36.0–46.0)
Hemoglobin: 12.4 g/dL (ref 12.0–15.0)
MCH: 26.6 pg (ref 26.0–34.0)
MCHC: 34.2 g/dL (ref 30.0–36.0)
MCV: 77.7 fL — ABNORMAL LOW (ref 80.0–100.0)
Platelets: 305 10*3/uL (ref 150–400)
RBC: 4.67 MIL/uL (ref 3.87–5.11)
RDW: 13.4 % (ref 11.5–15.5)
WBC: 8.6 10*3/uL (ref 4.0–10.5)
nRBC: 0 % (ref 0.0–0.2)

## 2022-03-19 LAB — ABO/RH: ABO/RH(D): O POS

## 2022-03-19 LAB — HCG, QUANTITATIVE, PREGNANCY: hCG, Beta Chain, Quant, S: 43946 m[IU]/mL — ABNORMAL HIGH (ref <5)

## 2022-03-19 MED ORDER — BLOOD PRESSURE KIT DEVI: 1.0000 | 0 refills | Status: DC

## 2022-03-19 NOTE — MAU Note (Signed)
..  Terri Bright is a 36 y.o. at [redacted]w[redacted]d here in MAU reporting: Abdominal cramping that started at 6pm. Denies vaginal bleeding.  Reports she was at [facility] earlier today and was told baby is measuring smaller than 9 weeks and they did not find a heart beat.   Pain score: 3/10 Vitals:   03/19/22 2035  BP: 130/74  Pulse: 91  Resp: 17  Temp: 98.2 F (36.8 C)  SpO2: 100%

## 2022-03-19 NOTE — Progress Notes (Signed)
New OB Intake  I connected with  Terri Bright on 03/19/22 at  3:10 PM EDT by in person and verified that I am speaking with the correct person using two identifiers. Nurse is located at [facility] and pt is located at [place].  I discussed the limitations, risks, security and privacy concerns of performing an evaluation and management service by telephone and the availability of in person appointments. I also discussed with the patient that there may be a patient responsible charge related to this service. The patient expressed understanding and agreed to proceed.  I explained I am completing New OB Intake today. We discussed that patient will be scheduled for a formal scan to reassess for viability. Provider discussed plan and options with patient. Pt is G3/P1011. I reviewed her allergies, medications, Medical/Surgical/OB history, and appropriate screenings. I informed her of [facility] services. [facility] information placed in AVS. Based on history, this is a/an  pregnancy uncomplicated .   Patient Active Problem List   Diagnosis Date Noted   Supervision of high risk pregnancy, antepartum 02/20/2022   Hashimoto's disease 04/24/2021   Abnormal uterine bleeding (AUB) 04/24/2021    Concerns addressed today  Delivery Plans Plans to deliver at [facility]. Patient given information for [facility] website for more information about [facility]. Patient is not interested in water birth. Offered upcoming OB visit with CNM to discuss further.  MyChart/Babyscripts MyChart access verified. I explained pt will have some visits in office and some virtually. Babyscripts instructions given and order placed. Patient verifies receipt of registration text/e-mail. Account successfully created and app downloaded.  Blood Pressure Cuff/Weight Scale Blood pressure cuff ordered for patient to pick-up from [employer]. Explained after first prenatal appt pt will check weekly and document in  Babyscripts. Patient does have weight scale.  Anatomy US Explained first scheduled [place] will be around 19 weeks. Dating and viability scan performed today. Anatomy scan to be scheduled at a later date.  Labs Discussed Avelina Laine genetic screening with patient. Would like both Panorama and Horizon drawn at new OB visit. Routine prenatal labs needed.  Covid Vaccine Patient has covid vaccine.   Is patient a CenteringPregnancy candidate?  Declined Declined due to Group Setting Not a candidate due to  Centering Patient" indicated on sticky note  Social Determinants of Health Food Insecurity: Patient denies food insecurity. WIC Referral: Patient is interested in referral to [facility].  Transportation: Patient denies transportation needs. Childcare: Discussed no children allowed at ultrasound appointments. Offered childcare services; patient declines childcare services at this time.  First visit review I reviewed new OB appt with pt. I explained she will have a provider visit that includes prenatal labs, std testing, pap, and discuss plan for pregnancy. Explained pt will be seen by Nettie Elm at first visit; encounter routed to appropriate provider. Explained that patient will be seen by pregnancy navigator following visit with provider.   Terri Capri, RN 03/19/2022  3:34 PM

## 2022-03-19 NOTE — MAU Provider Note (Signed)
History     CSN: [id number]  Arrival date and time: 03/19/22 1910   Event Date/Time   First Provider Initiated Contact with Patient 03/19/22 2123      Chief Complaint  Patient presents with   Abdominal Pain   HPI Terri Bright is a 36 y.o. G3P1011 at [redacted]w[redacted]d who presents to MAU with chief complaint of abdominal cramping. This is a new problem, onset following patient's transvaginal ultrasound around 4pm today. Patient reports she was told her baby did not have a heartbeat and she was given options for next steps. She is worried that her abdominal pain is the beginning of a spontaneous miscarriage. Pain score on arrival to MAU is 3/10. She denies aggravating or alleviating factors. She has not taken medication for this complaint. She denies vaginal bleeding, dysuria, fever or recent illness.  She has initiated care with [facility].  OB History     Gravida  3   Para  1   Term  1   Preterm      AB  1   Living  1      SAB  1   IAB      Ectopic      Multiple      Live Births  1           Past Medical History:  Diagnosis Date   Asthma    History of PCOS    Hypothyroidism    Sickle cell trait ([place])    Spinal stenosis    Thyroid disease     No past surgical history on file.  Family History  Problem Relation Age of Onset   Hypertension Mother    Diabetes Mother    Hypothyroidism Mother    Seizures Father    Diabetes Mellitus I Paternal Grandmother    Hyperlipidemia Paternal Grandfather    Diabetes Mellitus I Paternal Grandfather     Social History   Tobacco Use   Smoking status: Former    Types: Cigarettes    Quit date: 02/2020    Years since quitting: 2.1   Smokeless tobacco: Never  Vaping Use   Vaping Use: Never used  Substance Use Topics   Alcohol use: Not Currently    Comment: ocassionally, prior to pregnancy   Drug use: Never    Allergies:  Allergies  Allergen Reactions   Bactrim [Sulfamethoxazole-Trimethoprim]     Medications Prior  to Admission  Medication Sig Dispense Refill Last Dose   Blood Pressure Monitoring (BLOOD PRESSURE KIT) DEVI 1 kit by Does not apply route once a week. 1 each 0    Prenatal MV & Min w/FA-DHA (PRENATAL GUMMIES PO) Take 2 tablets by mouth daily.       Review of Systems  Gastrointestinal:  Positive for abdominal pain.  All other systems reviewed and are negative.  Physical Exam   Blood pressure 130/74, pulse 91, temperature 98.2 F (36.8 C), temperature source Oral, resp. rate 17, height $RemoveBe'5\' 4"'vBlhOHPIc$  (1.626 m), weight 88.2 kg, last menstrual period 01/11/2022, SpO2 100 %.  Physical Exam Vitals and nursing note reviewed. Exam conducted with a chaperone present.  Constitutional:      Appearance: She is well-developed.  Cardiovascular:     Rate and Rhythm: Normal rate.     Heart sounds: Normal heart sounds.  Pulmonary:     Effort: Pulmonary effort is normal.  Abdominal:     General: Abdomen is flat.  Skin:    General: Skin is warm.  Capillary Refill: Capillary refill takes less than 2 seconds.  Neurological:     Mental Status: She is alert and oriented to person, place, and time.  Psychiatric:        Mood and Affect: Mood normal.        Behavior: Behavior normal.     MAU Course  Procedures  MDM  --Ultrasound performed in office today, no final report yet --Discussed with MAU sonographer, will need to reaccomplish due to resolution of images  Patient Vitals for the past 24 hrs:  BP Temp Temp src Pulse Resp SpO2 Height Weight  03/19/22 2035 130/74 98.2 F (36.8 C) Oral 91 17 100 % $Rem'5\' 4"'LiFr$  (1.626 m) 88.2 kg   Orders Placed This Encounter  Procedures   US OB LESS THAN 14 WEEKS WITH OB TRANSVAGINAL   CBC   hCG, quantitative, pregnancy   ABO/Rh   Discharge patient   Patient Vitals for the past 24 hrs:  BP Temp Temp src Pulse Resp SpO2 Height Weight  03/19/22 2035 130/74 98.2 F (36.8 C) Oral 91 17 100 % $Rem'5\' 4"'bmQQ$  (1.626 m) 88.2 kg   Results for orders placed or performed  during the hospital encounter of 03/19/22 (from the past 24 hour(s))  CBC     Status: Abnormal   Collection Time: 03/19/22  8:34 PM  Result Value Ref Range   WBC 8.6 4.0 - 10.5 K/uL   RBC 4.67 3.87 - 5.11 MIL/uL   Hemoglobin 12.4 12.0 - 15.0 g/dL   HCT 36.3 36.0 - 46.0 %   MCV 77.7 (L) 80.0 - 100.0 fL   MCH 26.6 26.0 - 34.0 pg   MCHC 34.2 30.0 - 36.0 g/dL   RDW 13.4 11.5 - 15.5 %   Platelets 305 150 - 400 K/uL   nRBC 0.0 0.0 - 0.2 %  hCG, quantitative, pregnancy     Status: Abnormal   Collection Time: 03/19/22  8:34 PM  Result Value Ref Range   hCG, Beta Chain, Quant, S 43,946 (H) <5 mIU/mL  ABO/Rh     Status: None   Collection Time: 03/19/22  8:34 PM  Result Value Ref Range   ABO/RH(D) O POS    No rh immune globuloin      NOT A RH IMMUNE GLOBULIN CANDIDATE, PT RH POSITIVE Performed at  [facility], [place] [address], [place], [place] [address]    US OB LESS THAN 14 WEEKS WITH [place] TRANSVAGINAL  Result Date: 03/19/2022 CLINICAL DATA:  Follow-up for viability. EXAM: OBSTETRIC <14 WK ULTRASOUND TECHNIQUE: Transabdominal ultrasound was performed for evaluation of the gestation as well as the maternal uterus and adnexal regions. COMPARISON:  11/17/2021. FINDINGS: Intrauterine gestational sac: Single Yolk sac:  Yes Embryo:  Yes Cardiac Activity: No Heart Rate: None CRL:   22.7 mm   9 w 0 d                  [place] EDC: 10/22/2022. Subchorionic hemorrhage:  None visualized. Maternal uterus/adnexae: The ovaries are within normal limits. No free fluid in the pelvis. IMPRESSION: Single intrauterine gestational sac containing a yolk sac and fetal pole with estimated gestational age of [redacted] weeks 0 days. No fetal cardiac activity or heart rate is seen at this time, concerning for failed early pregnancy. Electronically Signed   By: Brett Fairy M.D.   On: 03/19/2022 21:13       Assessment and Plan  --36 y.o. G3P1011 measuring 9w 0d --Findings consistent with failed pregnancy per  above  criteria --Patient elects expectant management --Bleeding precautions reviewed --Discharge home in stable condition  F/U: --At the end of this afternoon's outpatient appointment she was scheduled for repeat US 07/24 --Patient states that her hx of being a fertility patient causes her to want to keep this appointment  Darlina Rumpf, [place], MSN, CNM 03/19/2022, 12:21 AM

## 2022-03-20 NOTE — Progress Notes (Unsigned)
Tysheena Ginzburg presents today for new OB intake. Based on sure LMP she should be [redacted]w[redacted]d today. She has PCOS and was about to start [facility] when she spontaneously conceived. She denies pain or bleeding today. She had RN intake and US performed and I was asked to review [place] results with the patient. [place] was re-performed in my presence. SIUP noted without FHR at ~ [redacted] weeks gestation.   Patient was notified that with current CRL, FHR should be present and likely was present and has now stopped which will result in loss of the pregnancy. The patient was reassured that there is nothing that she could have done to cause the demise nor could we have prevented it with earlier appointments or intervention.   Patient was given the options of formal US with MFM at [facility] prior to discussing options for management and opted to schedule follow-up US at this time.   Patient was advised that it is safe to wait for management as long as she does not develop fever. Warning signs for changes in condition warranting evaluation were discussed including fever, heavy vaginal bleeding or severe abdominal pain. Patient was advised of when to call the office and when to present to MAU. Options for management were briefly discussed including expectant management, Cytotec or surgery. Patient will await formal US results.   Patient was visibly upset, but otherwise stable. This was a desired pregnancy. We will follow-up after next [place] as indicated.   Vonzella Nipple, PA-C 03/20/2022 12:51 PM

## 2022-03-26 ENCOUNTER — Ambulatory Visit
Admission: RE | Admit: 2022-03-26 | Discharge: 2022-03-26 | Disposition: A | Discharge status: Home / Self Care | Payer: Medicaid Other | Source: Ambulatory Visit | Attending: Medical | Admitting: Medical

## 2022-03-26 ENCOUNTER — Other Ambulatory Visit: Payer: Self-pay | Admitting: Medical

## 2022-03-26 ENCOUNTER — Ambulatory Visit (INDEPENDENT_AMBULATORY_CARE_PROVIDER_SITE_OTHER): Payer: Medicaid Other | Admitting: Obstetrics and Gynecology

## 2022-03-26 ENCOUNTER — Encounter: Payer: Self-pay | Admitting: Family Medicine

## 2022-03-26 ENCOUNTER — Encounter: Payer: Self-pay | Admitting: Obstetrics and Gynecology

## 2022-03-26 VITALS — BP 108/77 | HR 80 | Ht 64.0 in

## 2022-03-26 DIAGNOSIS — O021 Missed abortion: Secondary | ICD-10-CM | POA: Diagnosis not present

## 2022-03-26 DIAGNOSIS — O3680X0 Pregnancy with inconclusive fetal viability, not applicable or unspecified: Secondary | ICD-10-CM | POA: Insufficient documentation

## 2022-03-26 DIAGNOSIS — Z3A08 8 weeks gestation of pregnancy: Secondary | ICD-10-CM | POA: Diagnosis not present

## 2022-03-26 MED ORDER — ONDANSETRON 4 MG PO TBDP: 4.0000 mg | ORAL_TABLET | Freq: Four times a day (QID) | ORAL | 0 refills | Status: DC | PRN

## 2022-03-26 MED ORDER — OXYCODONE HCL 5 MG PO TABS: 5.0000 mg | ORAL_TABLET | ORAL | 0 refills | Status: DC | PRN

## 2022-03-26 MED ORDER — MISOPROSTOL 200 MCG PO TABS: 800.0000 ug | ORAL_TABLET | Freq: Once | ORAL | 1 refills | Status: DC

## 2022-03-26 NOTE — Progress Notes (Signed)
Pt sent from [place] dept to receive results of ultrasound today. Pt reports no bleeding and has only minimal back pain/cramping in her sides. Ultrasound results confirm failed pregnancy - reviewed by Dr. Para March. Pt desires to know options for care @ this time.

## 2022-03-26 NOTE — Progress Notes (Signed)
GYNECOLOGY OFFICE VISIT NOTE  History:   Terri Bright is a 36 y.o. G3P1011 here today for discussion of options of miscarriage. She has not been bleeding. She is here with her partner as support.     Past Medical History:  Diagnosis Date   Asthma    History of PCOS    Hypothyroidism    Sickle cell trait ([facility])    Spinal stenosis    Thyroid disease     History reviewed. No pertinent surgical history.  The following portions of the patient's history were reviewed and updated as appropriate: allergies, current medications, past family history, past medical history, past social history, past surgical history and problem list.   Health Maintenance:   No pap on file  Review of Systems:  Pertinent items noted in HPI and remainder of comprehensive ROS otherwise negative.  Physical Exam:  BP 108/77   Pulse 80   Ht 5\' 4"  (1.626 m)   LMP 01/11/2022 (Exact Date)   BMI 33.37 kg/m  CONSTITUTIONAL: Well-developed, well-nourished female in no acute distress.  HEENT:  Normocephalic, atraumatic. External right and left ear normal. No scleral icterus.  NECK: Normal range of motion, supple, no masses noted on observation SKIN: No rash noted. Not diaphoretic. No erythema. No pallor. MUSCULOSKELETAL: Normal range of motion. No edema noted. NEUROLOGIC: Alert and oriented to person, place, and time. Normal muscle tone coordination. No cranial nerve deficit noted. PSYCHIATRIC: Normal mood and affect. Normal behavior. Normal judgment and thought content.  CARDIOVASCULAR: Normal heart rate noted RESPIRATORY: Effort and breath sounds normal, no problems with respiration noted ABDOMEN: No masses noted. No other overt distention noted.    PELVIC: Deferred  Labs and Imaging Results for orders placed or performed during the hospital encounter of 03/19/22 (from the past 168 hour(s))  CBC   Collection Time: 03/19/22  8:34 PM  Result Value Ref Range   WBC 8.6 4.0 - 10.5 K/uL   RBC 4.67 3.87 -  5.11 MIL/uL   Hemoglobin 12.4 12.0 - 15.0 g/dL   HCT 03/21/22 76.8 - 08.8 %   MCV 77.7 (L) 80.0 - 100.0 fL   MCH 26.6 26.0 - 34.0 pg   MCHC 34.2 30.0 - 36.0 g/dL   RDW 11.0 31.5 - 94.5 %   Platelets 305 150 - 400 K/uL   nRBC 0.0 0.0 - 0.2 %  hCG, quantitative, pregnancy   Collection Time: 03/19/22  8:34 PM  Result Value Ref Range   hCG, Beta Chain, Quant, S 43,946 (H) <5 mIU/mL  ABO/Rh   Collection Time: 03/19/22  8:34 PM  Result Value Ref Range   ABO/RH(D) O POS    No rh immune globuloin      NOT A RH IMMUNE GLOBULIN CANDIDATE, PT RH POSITIVE Performed at [facility], [address] [place], [place]    [address] OB Transvaginal  Result Date: 03/26/2022 CLINICAL DATA:  Catheters terminate fetal viability. EXAM: TRANSVAGINAL OB ULTRASOUND TECHNIQUE: Transvaginal ultrasound was performed for complete evaluation of the gestation as well as the maternal uterus, adnexal regions, and pelvic cul-de-sac. COMPARISON:  03/19/2022. FINDINGS: Intrauterine gestational sac: Single Yolk sac: Present. Embryo:  Present. Cardiac Activity: Absent. CRL:   21.3 mm   8 w 5 d                  03/21/2022 EDC: 10/31/2022 Subchorionic hemorrhage:  None visualized. Maternal uterus/adnexae: Corpus luteum cyst on the right. Left ovary is unremarkable. No free fluid. IMPRESSION:  Findings meet definitive criteria for failed pregnancy. This follows SRU consensus guidelines: Diagnostic Criteria for Nonviable Pregnancy Early in the First Trimester. [facility] J Med [phone]. Electronically Signed   By: Leanna Battles M.D.   On: 03/26/2022 13:33   US OB Limited  Result Date: 03/21/2022 ----------------------------------------------------------------------  OBSTETRICS REPORT                       (Signed Final 03/21/2022 02:22 pm) ---------------------------------------------------------------------- Patient Info  ID #:       [id number]                          D.O.B.:  01/29/1986 (35 yrs)  Name:       Terri Bright                     Visit Date: 03/19/2022 04:47 pm ---------------------------------------------------------------------- Performed By  Attending:        Jaynie Collins         Ref. Address:     [address]                    [place]                                                             [address]                                                             [place], [place]                                                             [address]  Performed By:     Angelica Pou RN         [facility]         [facility]                                                             [facility] [facility]  [place]  Referred By:      Tereso Newcomer MD ---------------------------------------------------------------------- Orders  #  Description                           Code        Ordered By  1  US OB LIMITED                         22979.8     Jaynie Collins ----------------------------------------------------------------------  #  Order #                     Accession #                Episode #  1  [id number]                   [id number]                 [id number] ---------------------------------------------------------------------- Indications  [redacted] weeks gestation of pregnancy                 Z3A.08 ---------------------------------------------------------------------- Fetal Evaluation  Num Of Fetuses:         1  Cardiac Activity:       Not visualized ---------------------------------------------------------------------- Biometry  CRL:      23.1  mm     G. Age:  8w 6d                   EDD:   10/23/22 ---------------------------------------------------------------------- Gestational Age  Best:          8w 6d      Det. By:  U/S C R L (03/19/22)     EDD:   10/23/22 ---------------------------------------------------------------------- Comments  Formal scan scheduled to reassess for viability. No FHR  seen on u/s  today. ---------------------------------------------------------------------- Impression  Likely failed pregnancy around [redacted] weeks gestation ---------------------------------------------------------------------- Recommendations  Patient was counseled regarding findings by Vonzella Nipple,  PA-C. Please refer to her notes for more details.  Formal ultrasound was ordered, will follow up results. ----------------------------------------------------------------------              Jaynie Collins, MD Electronically Signed Final Report   03/21/2022 02:22 pm ----------------------------------------------------------------------  US OB LESS THAN 14 WEEKS WITH OB TRANSVAGINAL  Result Date: 03/19/2022 CLINICAL DATA:  Follow-up for viability. EXAM: OBSTETRIC <14 WK ULTRASOUND TECHNIQUE: Transabdominal ultrasound was performed for evaluation of the gestation as well as the maternal uterus and adnexal regions. COMPARISON:  11/17/2021. FINDINGS: Intrauterine gestational sac: Single Yolk sac:  Yes Embryo:  Yes Cardiac Activity: No Heart Rate: None CRL:   22.7 mm   9 w 0 d                  [place] EDC: 10/22/2022. Subchorionic hemorrhage:  None visualized. Maternal uterus/adnexae: The ovaries are within normal limits. No free fluid in the pelvis. IMPRESSION: Single intrauterine gestational sac containing a yolk sac and fetal pole with estimated gestational age of [redacted] weeks 0 days. No fetal cardiac activity or heart rate is seen at this time, concerning for failed early pregnancy. Electronically Signed   By: Thornell Sartorius M.D.   On: 03/19/2022 21:13    Assessment and Plan:   1. Missed abortion - Discussed options: expectant management, mife/misoprostol and D&E. Discussed  the risks and benefits to each.   - We discussed dosing and process of miso: Discussed normal bleeding and cramping with the medication. Discussed pain medication often times needed when medically induced for missed abortion. Discussed success rate is depending on  gestational age at the time.  - We discussed genetics: we reviewed the benefits - she Declines - Risks of surgery include but are not limited to: bleeding, infection, injury to surrounding organs/tissues (i.e. bowel/bladder/ureters), need for additional procedures, wound complications, hospital re-admission, and conversion to open surgery - We discussed postop restrictions, precautions and expectations. We discussed typical hospital course and stay.  - She would like: Medical therapy -     US PELVIC COMPLETE WITH TRANSVAGINAL; Future -     misoprostol (CYTOTEC) 200 MCG tablet; Place 4 tablets (800 mcg total) vaginally once for 1 dose. If nothing happens may repeat in 24 hours -     oxyCODONE (OXY IR/ROXICODONE) 5 MG immediate release tablet; Take 1 tablet (5 mg total) by mouth every 4 (four) hours as needed for severe pain or breakthrough pain. -     ondansetron (ZOFRAN-ODT) 4 MG disintegrating tablet; Take 1 tablet (4 mg total) by mouth every 6 (six) hours as needed for nausea.    Routine preventative health maintenance measures emphasized. Please refer to After Visit Summary for other counseling recommendations.   Return in about 3 months (around 06/26/2022), or if symptoms worsen or fail to improve, for annual.  Milas Hock, MD, FACOG Obstetrician & Gynecologist, [facility] [employer], [facility]

## 2022-03-27 NOTE — Progress Notes (Signed)
Patient was assessed and managed by nursing staff during this encounter. I have reviewed the chart and agree with the documentation and plan. I have also made any necessary editorial changes.  Scheryl Darter, MD 03/27/2022 3:05 PM

## 2022-03-28 ENCOUNTER — Encounter: Payer: Self-pay | Admitting: Obstetrics and Gynecology

## 2022-03-28 DIAGNOSIS — R300 Dysuria: Secondary | ICD-10-CM

## 2022-03-28 MED ORDER — NITROFURANTOIN MONOHYD MACRO 100 MG PO CAPS: 100.0000 mg | ORAL_CAPSULE | Freq: Two times a day (BID) | ORAL | 0 refills | Status: DC

## 2022-04-04 ENCOUNTER — Ambulatory Visit
Admission: RE | Admit: 2022-04-04 | Discharge: 2022-04-04 | Disposition: A | Discharge status: Home / Self Care | Payer: Medicaid Other | Source: Ambulatory Visit | Attending: Obstetrics and Gynecology | Admitting: Obstetrics and Gynecology

## 2022-04-04 DIAGNOSIS — O021 Missed abortion: Secondary | ICD-10-CM | POA: Diagnosis not present

## 2022-04-17 ENCOUNTER — Encounter: Payer: Medicaid Other | Admitting: Obstetrics and Gynecology

## 2022-06-23 ENCOUNTER — Telehealth: Payer: Medicaid Other | Admitting: Physician Assistant

## 2022-06-23 ENCOUNTER — Encounter (HOSPITAL_COMMUNITY): Payer: Self-pay | Admitting: Emergency Medicine

## 2022-06-23 ENCOUNTER — Inpatient Hospital Stay (HOSPITAL_COMMUNITY)
Admission: AD | Admit: 2022-06-23 | Discharge: 2022-06-24 | Disposition: A | Discharge status: Home / Self Care | Payer: Medicaid Other | Attending: Family Medicine | Admitting: Family Medicine

## 2022-06-23 ENCOUNTER — Other Ambulatory Visit: Payer: Self-pay | Admitting: Certified Nurse Midwife

## 2022-06-23 ENCOUNTER — Other Ambulatory Visit: Payer: Self-pay

## 2022-06-23 ENCOUNTER — Emergency Department (HOSPITAL_COMMUNITY): Payer: Medicaid Other

## 2022-06-23 DIAGNOSIS — M549 Dorsalgia, unspecified: Secondary | ICD-10-CM | POA: Diagnosis not present

## 2022-06-23 DIAGNOSIS — N3000 Acute cystitis without hematuria: Secondary | ICD-10-CM | POA: Insufficient documentation

## 2022-06-23 DIAGNOSIS — R1011 Right upper quadrant pain: Secondary | ICD-10-CM | POA: Diagnosis not present

## 2022-06-23 DIAGNOSIS — R109 Unspecified abdominal pain: Secondary | ICD-10-CM

## 2022-06-23 DIAGNOSIS — R509 Fever, unspecified: Secondary | ICD-10-CM

## 2022-06-23 DIAGNOSIS — R1031 Right lower quadrant pain: Secondary | ICD-10-CM | POA: Insufficient documentation

## 2022-06-23 DIAGNOSIS — R160 Hepatomegaly, not elsewhere classified: Secondary | ICD-10-CM | POA: Diagnosis not present

## 2022-06-23 DIAGNOSIS — M545 Low back pain, unspecified: Secondary | ICD-10-CM | POA: Diagnosis not present

## 2022-06-23 DIAGNOSIS — K760 Fatty (change of) liver, not elsewhere classified: Secondary | ICD-10-CM | POA: Diagnosis not present

## 2022-06-23 DIAGNOSIS — R300 Dysuria: Secondary | ICD-10-CM

## 2022-06-23 LAB — CBC WITH DIFFERENTIAL/PLATELET
Abs Immature Granulocytes: 0.04 10*3/uL (ref 0.00–0.07)
Basophils Absolute: 0 10*3/uL (ref 0.0–0.1)
Basophils Relative: 0 %
Eosinophils Absolute: 0.1 10*3/uL (ref 0.0–0.5)
Eosinophils Relative: 1 %
HCT: 37.4 % (ref 36.0–46.0)
Hemoglobin: 12.9 g/dL (ref 12.0–15.0)
Immature Granulocytes: 1 %
Lymphocytes Relative: 23 %
Lymphs Abs: 1.9 10*3/uL (ref 0.7–4.0)
MCH: 27.3 pg (ref 26.0–34.0)
MCHC: 34.5 g/dL (ref 30.0–36.0)
MCV: 79.1 fL — ABNORMAL LOW (ref 80.0–100.0)
Monocytes Absolute: 0.9 10*3/uL (ref 0.1–1.0)
Monocytes Relative: 11 %
Neutro Abs: 5.2 10*3/uL (ref 1.7–7.7)
Neutrophils Relative %: 64 %
Platelets: 353 10*3/uL (ref 150–400)
RBC: 4.73 MIL/uL (ref 3.87–5.11)
RDW: 13.1 % (ref 11.5–15.5)
WBC: 8.1 10*3/uL (ref 4.0–10.5)
nRBC: 0 % (ref 0.0–0.2)

## 2022-06-23 LAB — URINALYSIS, ROUTINE W REFLEX MICROSCOPIC
Bilirubin Urine: NEGATIVE
Glucose, UA: NEGATIVE mg/dL
Ketones, ur: NEGATIVE mg/dL
Nitrite: NEGATIVE
Protein, ur: 100 mg/dL — AB
Specific Gravity, Urine: 1.014 (ref 1.005–1.030)
WBC, UA: >50 WBC/hpf — ABNORMAL HIGH (ref 0–5)
pH: 5 (ref 5.0–8.0)

## 2022-06-23 LAB — COMPREHENSIVE METABOLIC PANEL
ALT: 32 U/L (ref 0–44)
AST: 19 U/L (ref 15–41)
Albumin: 3.7 g/dL (ref 3.5–5.0)
Alkaline Phosphatase: 68 U/L (ref 38–126)
Anion gap: 9 (ref 5–15)
BUN: 8 mg/dL (ref 6–20)
CO2: 25 mmol/L (ref 22–32)
Calcium: 9.5 mg/dL (ref 8.9–10.3)
Chloride: 103 mmol/L (ref 98–111)
Creatinine, Ser: 0.77 mg/dL (ref 0.44–1.00)
GFR, Estimated: >60 mL/min (ref >60)
Glucose, Bld: 212 mg/dL — ABNORMAL HIGH (ref 70–99)
Potassium: 3.8 mmol/L (ref 3.5–5.1)
Sodium: 137 mmol/L (ref 135–145)
Total Bilirubin: 0.9 mg/dL (ref 0.3–1.2)
Total Protein: 7.7 g/dL (ref 6.5–8.1)

## 2022-06-23 LAB — LIPASE, BLOOD: Lipase: 26 U/L (ref 11–51)

## 2022-06-23 LAB — I-STAT BETA HCG BLOOD, ED (MC, WL, AP ONLY): I-stat hCG, quantitative: <5 m[IU]/mL (ref <5)

## 2022-06-23 NOTE — ED Triage Notes (Signed)
Patient reports fever with right lateral abdominal pain and pain at entire back with dry mouth onset yesterday . No emesis or diarrhea .

## 2022-06-23 NOTE — ED Provider Triage Note (Signed)
Emergency Medicine Provider Triage Evaluation Note  Terri Bright , a 36 y.o. female  was evaluated in triage.  Pt complains of right sided abdominal pain and fever.  Ongoing for a few days now.  States she has poor appetite, no nausea/vomiting/diarrhea.  Son recently sick with a cold.  Took home covid test that was negative PTA.  Review of Systems  Positive: Fever, abdominal pain Negative: vomiting  Physical Exam  BP 126/88 (BP Location: Right Arm)   Pulse (!) 110   Temp 98.4 F (36.9 C) (Oral)   Resp 18   LMP 01/11/2022 (Exact Date)   SpO2 99%  Gen:   Awake, no distress   Resp:  Normal effort  MSK:   Moves extremities without difficulty  Other:  Tender RUQ without murphy's sign  Medical Decision Making  Medically screening exam initiated at 10:36 PM.  Appropriate orders placed.  Jahnasia Tatum was informed that the remainder of the evaluation will be completed by another provider, this initial triage assessment does not replace that evaluation, and the importance of remaining in the ED until their evaluation is complete.  Fever, RUQ pain.  Afebrile in triage.  Tender RUQ without murphy's sign.  Will check labs, UA, RUQ [place].  Home covid test PTA was negative.   Larene Pickett, PA-C 06/23/22 2238

## 2022-06-23 NOTE — Progress Notes (Signed)
Based on what you shared with me, I feel your condition warrants further evaluation as soon as possible at an Emergency department.  High concern for a possible kideny or other intraabdominal infection giving your constellation of symptoms.    NOTE: There will be NO CHARGE for this eVisit   If you are having a true medical emergency please call 911.      Emergency [facility]  Get Driving Directions  [phone]  [address]  [place], [place] [address]  Open 24/7/365      [facility] Emergency Department at [place]  [address]  [place], [place] [address]  Open 24/7/365    Emergency [facility]  Get Driving Directions  [phone]  [address], [place] [address]  Open 24/7/365      [facility] at [facility]  Get Driving Directions  [phone]  [address]  [place], [place] [address]  Open 24/7/365    [facility]  Emergency [place]  Get Driving Directions  [phone]  [place], [place] [address]  Open 24/7/365    [place]  Get Driving Directions  [address]  [place], [place] [address]  Open 24/7/365    [facility]  Emergency [facility]  Get Driving Directions  [phone]  [address]  [place],  [address]  Open 24/7/365

## 2022-06-24 ENCOUNTER — Encounter (HOSPITAL_COMMUNITY): Payer: Self-pay | Admitting: Family Medicine

## 2022-06-24 ENCOUNTER — Telehealth: Payer: Medicaid Other | Admitting: Nurse Practitioner

## 2022-06-24 DIAGNOSIS — N3000 Acute cystitis without hematuria: Secondary | ICD-10-CM

## 2022-06-24 DIAGNOSIS — R399 Unspecified symptoms and signs involving the genitourinary system: Secondary | ICD-10-CM

## 2022-06-24 MED ORDER — NITROFURANTOIN MONOHYD MACRO 100 MG PO CAPS: 100.0000 mg | ORAL_CAPSULE | Freq: Two times a day (BID) | ORAL | 0 refills | Status: DC

## 2022-06-24 NOTE — Progress Notes (Signed)
I have spent 5 minutes in review of e-visit questionnaire, review and updating patient chart, medical decision making and response to patient.  ° °Deretha Ertle W Anja Neuzil, NP ° °  °

## 2022-06-24 NOTE — MAU Provider Note (Signed)
History     CSN: [id number]  Arrival date and time: 06/23/22 2222   Event Date/Time   First Provider Initiated Contact with Patient 06/24/22 1118      Chief Complaint  Patient presents with   Abdominal Pain    Fever/Back pain    Terri Bright is a 36 y.o. [id number] who presents today with right side abdominal pain.   Abdominal Pain This is a new problem. The current episode started yesterday. The onset quality is gradual. The problem occurs constantly. The problem has been unchanged. The pain is located in the RLQ and RUQ. The pain is at a severity of 5/10. The quality of the pain is sharp. The abdominal pain radiates to the right flank. Associated symptoms include a fever (subjective fever a home. 101-103). Pertinent negatives include no nausea or vomiting. Nothing aggravates the pain. The pain is relieved by Nothing. She has tried acetaminophen for the symptoms. The treatment provided significant relief.    OB History     Gravida  3   Para  1   Term  1   Preterm      AB  2   Living  1      SAB  2   IAB      Ectopic      Multiple      Live Births  1           Past Medical History:  Diagnosis Date   Asthma    History of PCOS    Hypothyroidism    Sickle cell trait ([place])    Spinal stenosis    Thyroid disease     Past Surgical History:  Procedure Laterality Date   NO PAST SURGERIES      Family History  Problem Relation Age of Onset   Hypertension Mother    Diabetes Mother    Hypothyroidism Mother    Seizures Father    Diabetes Mellitus I Paternal Grandmother    Hyperlipidemia Paternal Grandfather    Diabetes Mellitus I Paternal Grandfather     Social History   Tobacco Use   Smoking status: Former    Types: Cigarettes    Quit date: 02/2020    Years since quitting: 2.3   Smokeless tobacco: Never  Vaping Use   Vaping Use: Never used  Substance Use Topics   Alcohol use: Not Currently    Comment: ocassionally, prior to pregnancy   Drug  use: Never    Allergies:  Allergies  Allergen Reactions   Bactrim [Sulfamethoxazole-Trimethoprim]     Medications Prior to Admission  Medication Sig Dispense Refill Last Dose   Blood Pressure Monitoring (BLOOD PRESSURE KIT) DEVI 1 kit by Does not apply route once a week. 1 each 0    misoprostol (CYTOTEC) 200 MCG tablet Place 4 tablets (800 mcg total) vaginally once for 1 dose. If nothing happens may repeat in 24 hours 4 tablet 1    nitrofurantoin, macrocrystal-monohydrate, (MACROBID) 100 MG capsule Take 1 capsule (100 mg total) by mouth 2 (two) times daily. 10 capsule 0    ondansetron (ZOFRAN-ODT) 4 MG disintegrating tablet Take 1 tablet (4 mg total) by mouth every 6 (six) hours as needed for nausea. 20 tablet 0    oxyCODONE (OXY IR/ROXICODONE) 5 MG immediate release tablet Take 1 tablet (5 mg total) by mouth every 4 (four) hours as needed for severe pain or breakthrough pain. 6 tablet 0    Prenatal MV & Min w/FA-DHA (PRENATAL GUMMIES PO) Take  2 tablets by mouth daily.       Review of Systems  Constitutional:  Positive for fever (subjective fever a home. 101-103).  Gastrointestinal:  Positive for abdominal pain. Negative for nausea and vomiting.  Genitourinary:  Positive for decreased urine volume.       Urine has been dark, cloudy and has an odor   All other systems reviewed and are negative.  Physical Exam   Blood pressure 124/75, pulse 87, temperature 98.2 F (36.8 C), resp. rate 19, SpO2 100 %.  Physical Exam Constitutional:      Appearance: She is well-developed.  HENT:     Head: Normocephalic.  Eyes:     Pupils: Pupils are equal, round, and reactive to light.  Cardiovascular:     Rate and Rhythm: Normal rate and regular rhythm.     Heart sounds: Normal heart sounds.  Pulmonary:     Effort: Pulmonary effort is normal. No respiratory distress.     Breath sounds: Normal breath sounds.  Abdominal:     Palpations: Abdomen is soft.     Tenderness: There is no abdominal  tenderness. There is no right CVA tenderness or left CVA tenderness.  Genitourinary:    Vagina: No bleeding. Vaginal discharge: mucusy.    Comments: External: no lesion Vagina: small amount of white discharge     Musculoskeletal:        General: Normal range of motion.     Cervical back: Normal range of motion and neck supple.  Skin:    General: Skin is warm and dry.  Neurological:     Mental Status: She is alert and oriented to person, place, and time.  Psychiatric:        Mood and Affect: Mood normal.        Behavior: Behavior normal.    Results for orders placed or performed during the hospital encounter of 06/23/22 (from the past 24 hour(s))  Urinalysis, Routine w reflex microscopic Urine, Clean Catch     Status: Abnormal   Collection Time: 06/23/22 10:53 PM  Result Value Ref Range   Color, Urine YELLOW YELLOW   APPearance HAZY (A) CLEAR   Specific Gravity, Urine 1.014 1.005 - 1.030   pH 5.0 5.0 - 8.0   Glucose, UA NEGATIVE NEGATIVE mg/dL   Hgb urine dipstick LARGE (A) NEGATIVE   Bilirubin Urine NEGATIVE NEGATIVE   Ketones, ur NEGATIVE NEGATIVE mg/dL   Protein, ur 100 (A) NEGATIVE mg/dL   Nitrite NEGATIVE NEGATIVE   Leukocytes,Ua SMALL (A) NEGATIVE   RBC / HPF 11-20 0 - 5 RBC/hpf   WBC, UA >50 (H) 0 - 5 WBC/hpf   Bacteria, UA MANY (A) NONE SEEN   Squamous Epithelial / LPF 11-20 0 - 5   Mucus PRESENT   CBC with Differential     Status: Abnormal   Collection Time: 06/23/22 10:54 PM  Result Value Ref Range   WBC 8.1 4.0 - 10.5 K/uL   RBC 4.73 3.87 - 5.11 MIL/uL   Hemoglobin 12.9 12.0 - 15.0 g/dL   HCT 37.4 36.0 - 46.0 %   MCV 79.1 (L) 80.0 - 100.0 fL   MCH 27.3 26.0 - 34.0 pg   MCHC 34.5 30.0 - 36.0 g/dL   RDW 13.1 11.5 - 15.5 %   Platelets 353 150 - 400 K/uL   nRBC 0.0 0.0 - 0.2 %   Neutrophils Relative % 64 %   Neutro Abs 5.2 1.7 - 7.7 K/uL   Lymphocytes Relative 23 %  Lymphs Abs 1.9 0.7 - 4.0 K/uL   Monocytes Relative 11 %   Monocytes Absolute 0.9 0.1 -  1.0 K/uL   Eosinophils Relative 1 %   Eosinophils Absolute 0.1 0.0 - 0.5 K/uL   Basophils Relative 0 %   Basophils Absolute 0.0 0.0 - 0.1 K/uL   Immature Granulocytes 1 %   Abs Immature Granulocytes 0.04 0.00 - 0.07 K/uL  Comprehensive metabolic panel     Status: Abnormal   Collection Time: 06/23/22 10:54 PM  Result Value Ref Range   Sodium 137 135 - 145 mmol/L   Potassium 3.8 3.5 - 5.1 mmol/L   Chloride 103 98 - 111 mmol/L   CO2 25 22 - 32 mmol/L   Glucose, Bld 212 (H) 70 - 99 mg/dL   BUN 8 6 - 20 mg/dL   Creatinine, Ser 0.77 0.44 - 1.00 mg/dL   Calcium 9.5 8.9 - 10.3 mg/dL   Total Protein 7.7 6.5 - 8.1 g/dL   Albumin 3.7 3.5 - 5.0 g/dL   AST 19 15 - 41 U/L   ALT 32 0 - 44 U/L   Alkaline Phosphatase 68 38 - 126 U/L   Total Bilirubin 0.9 0.3 - 1.2 mg/dL   GFR, Estimated >60 >60 mL/min   Anion gap 9 5 - 15  Lipase, blood     Status: None   Collection Time: 06/23/22 10:54 PM  Result Value Ref Range   Lipase 26 11 - 51 U/L  I-Stat Beta hCG blood, ED ([facility], [facility], [facility] only)     Status: None   Collection Time: 06/23/22 10:58 PM  Result Value Ref Range   I-stat hCG, quantitative <5.0 <5 mIU/mL   Comment 3           US Abdomen Limited RUQ (LIVER/GB)  Result Date: 06/23/2022 CLINICAL DATA:  151471 RUQ pain 151471 EXAM: ULTRASOUND ABDOMEN LIMITED RIGHT UPPER QUADRANT COMPARISON:  None Available. FINDINGS: Gallbladder: No gallstones or wall thickening visualized. No sonographic Murphy sign noted by sonographer. Common bile duct: Diameter: 3 mm. Liver: Enlarged measuring up to 20 cm. No focal lesion identified. Increased parenchymal echogenicity. Portal vein is patent on color Doppler imaging with normal direction of blood flow towards the liver. Other: None. IMPRESSION: Hepatomegaly and hepatic steatosis. Please note limited evaluation for focal hepatic masses in a patient with hepatic steatosis due to decreased penetration of the acoustic ultrasound waves. Electronically Signed   By: Iven Finn M.D.   On: 06/23/2022 23:48    MAU Course  Procedures  MDM Reviewed [place] results and labs from the ED. Most likely UTI. We will treat at this time. Also of note, Glucose >200 on a random check and US shows hepatomegaly and hepatic steatosis. DW patient that she should follow up with PCP regarding these issues. She states that her mom has fatty liver and patient herself had GDM with her pregnancy.   Assessment and Plan   1. Acute cystitis without hematuria    DC home in stable condition  Comfort measures reviewed  RX: macrobid BID X 5 days  Return to MAU as needed FU with OB as planned   Follow-up Information     Program, [facility] Follow up.   Contact information: [place] [phone] Sully DNP, CNM  06/24/22  11:33 AM

## 2022-06-24 NOTE — ED Notes (Signed)
Taken MAU

## 2022-06-24 NOTE — MAU Note (Signed)
Transferred from [facility] with RUQ pain that started yesterday with a fever.  Pain is not as bad 5/10. Pt c/o HA now that is 10/10 attributes to being hungry and tired.  BP 129/71   Pulse 83   Temp 98.2 F (36.8 C)   Resp 18   SpO2 96%

## 2022-06-24 NOTE — ED Notes (Signed)
Pt refused vitals 

## 2022-06-24 NOTE — Progress Notes (Signed)
Terri Bright I would recommend you continue your visit in the ED for a thorough evaluation. You have a large amount of protein and blood in your urine. At this time I would not be able to prescribe an antibiotic until you have been discharged and there is a diagnosis for your current symptoms.

## 2022-06-26 LAB — URINE CULTURE: Culture: >=100000 — AB

## 2022-10-05 ENCOUNTER — Telehealth: Payer: Medicaid Other | Admitting: Physician Assistant

## 2022-10-05 DIAGNOSIS — R058 Other specified cough: Secondary | ICD-10-CM

## 2022-10-05 MED ORDER — FLUTICASONE PROPIONATE 50 MCG/ACT NA SUSP: 2.0000 | Freq: Every day | NASAL | 0 refills | Status: DC

## 2022-10-05 MED ORDER — ALBUTEROL SULFATE HFA 108 (90 BASE) MCG/ACT IN AERS: 1.0000 | INHALATION_SPRAY | Freq: Four times a day (QID) | RESPIRATORY_TRACT | 0 refills | Status: AC | PRN

## 2022-10-05 MED ORDER — PREDNISONE 10 MG (21) PO TBPK: ORAL_TABLET | ORAL | 0 refills | Status: DC

## 2022-10-05 MED ORDER — BENZONATATE 100 MG PO CAPS: 100.0000 mg | ORAL_CAPSULE | Freq: Three times a day (TID) | ORAL | 0 refills | Status: DC | PRN

## 2022-10-05 NOTE — Progress Notes (Signed)
We are sorry that you are not feeling well.  Here is how we plan to help!  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 This is a chronic cough after a moderate upper respiratory infection that is caused from residual inflammation in the airway from the previous infection.   In addition you may use A prescription cough medication called Tessalon Perles 100mg . You may take 1-2 capsules every 8 hours as needed for your cough. Flonase nasal spray 2 sprays in each nostril daily for 10 -14 days for post nasal drainage.  Prednisone 10 mg daily for 6 days (see taper instructions below)  Directions for 6 day taper: Day 1: 2 tablets before breakfast, 1 after both lunch & dinner and 2 at bedtime Day 2: 1 tab before breakfast, 1 after both lunch & dinner and 2 at bedtime Day 3: 1 tab at each meal & 1 at bedtime Day 4: 1 tab at breakfast, 1 at lunch, 1 at bedtime Day 5: 1 tab at breakfast & 1 tab at bedtime Day 6: 1 tab at breakfast  Albuterol inhaler 1-2 puffs every 6 hours as needed for wheezing.   From your responses in the eVisit questionnaire you describe inflammation in the upper respiratory tract which is causing a significant cough.  This is commonly called Bronchitis and has four common causes:   Allergies Viral Infections Acid Reflux Bacterial Infection Allergies, viruses and acid reflux are treated by controlling symptoms or eliminating the cause. An example might be a cough caused by taking certain blood pressure medications. You stop the cough by changing the medication. Another example might be a cough caused by acid reflux. Controlling the reflux helps control the cough.    HOME CARE Only take medications as instructed by your medical team. Complete the entire course of an antibiotic. Drink plenty of fluids and get plenty of rest. Avoid close contacts especially the very young and the elderly Cover your mouth if you cough or cough into your  sleeve. Always remember to wash your hands A steam or ultrasonic humidifier can help congestion.   GET HELP RIGHT AWAY IF: You develop worsening fever. You become short of breath You cough up blood. Your symptoms persist after you have completed your treatment plan MAKE SURE YOU  Understand these instructions. Will watch your condition. Will get help right away if you are not doing well or get worse.    Thank you for choosing an e-visit.  Your e-visit answers were reviewed by a board certified advanced clinical practitioner to complete your personal care plan. Depending upon the condition, your plan could have included both over the counter or prescription medications.  Please review your pharmacy choice. Make sure the pharmacy is open so you can pick up prescription now. If there is a problem, you may contact your provider through [employer] and have the prescription routed to another pharmacy.  Your safety is important to [place]. If you have drug allergies check your prescription carefully.   For the next 24 hours you can use MyChart to ask questions about today's visit, request a non-urgent call back, or ask for a work or school excuse. You will get an email in the next two days asking about your experience. I hope that your e-visit has been valuable and will speed your recovery.  I have spent 5 minutes in review of e-visit questionnaire, review and updating patient chart, medical decision making and response to patient.  Mar Daring, PA-C

## 2022-10-26 ENCOUNTER — Telehealth: Payer: Medicaid Other | Admitting: Physician Assistant

## 2022-10-26 DIAGNOSIS — U071 COVID-19: Secondary | ICD-10-CM | POA: Diagnosis not present

## 2022-10-26 MED ORDER — BENZONATATE 100 MG PO CAPS: 100.0000 mg | ORAL_CAPSULE | Freq: Three times a day (TID) | ORAL | 0 refills | Status: DC | PRN

## 2022-10-26 MED ORDER — PROMETHAZINE-DM 6.25-15 MG/5ML PO SYRP: 5.0000 mL | ORAL_SOLUTION | Freq: Four times a day (QID) | ORAL | 0 refills | Status: DC | PRN

## 2022-10-26 MED ORDER — FLUTICASONE PROPIONATE 50 MCG/ACT NA SUSP: 2.0000 | Freq: Every day | NASAL | 0 refills | Status: AC

## 2022-10-26 NOTE — Progress Notes (Signed)
E-Visit  for Positive Covid Test Result   We are sorry you are not feeling well. We are here to help!  You have tested positive for COVID-19, meaning that you were infected with the novel coronavirus and could give the virus to others.  It is vitally important that you stay home so you do not spread it to others.      Isolation Instructions:   You are to isolate for 5 days from onset of your symptoms. If you must be around other household members who do not have symptoms, you need to make sure that both you and the family members are masking consistently with a high-quality mask.  After day 5 of isolation, if you have had no fever within 24 hours and you are feeling better, you can end isolation but need to mask for an additional 5 days.  After day 5 if you have a fever or are having significant symptoms, please isolate for full 10 days.  If you note any worsening of symptoms despite treatment, please seek an in-person evaluation ASAP. If you note any significant shortness of breath or any chest pain, please seek ER evaluation. Please do not delay care!   Go to the nearest hospital ED for assessment if fever/cough/breathlessness are severe or illness seems like a threat to life.    The following symptoms may appear 2-14 days after exposure: Fever Cough Shortness of breath or difficulty breathing Chills Repeated shaking with chills Muscle pain Headache Sore throat New loss of taste or smell Fatigue Congestion or runny nose Nausea or vomiting Diarrhea  You can use medication such as prescription cough medication called Tessalon Perles 100 mg. You may take 1-2 capsules every 8 hours as needed for cough, prescription cough medication called Phenergan DM 6.25 mg/15 mg. You make take one teaspoon / 5 ml every 4-6 hours as needed for cough, and prescription for Fluticasone nasal spray 2 sprays in each nostril one time per day  You may also take acetaminophen (Tylenol) as needed for  fever.  HOME CARE: Only take medications as instructed by your medical team. Drink plenty of fluids and get plenty of rest. A steam or ultrasonic humidifier can help if you have congestion.   GET HELP RIGHT AWAY IF YOU HAVE EMERGENCY WARNING SIGNS.  Call 911 or proceed to your closest emergency facility if: You develop worsening high fever. Trouble breathing Bluish lips or face Persistent pain or pressure in the chest New confusion Inability to wake or stay awake You cough up blood. Your symptoms become more severe Inability to hold down food or fluids  This list is not all possible symptoms. Contact your medical provider for any symptoms that are severe or concerning to you.   Your e-visit answers were reviewed by a board certified advanced clinical practitioner to complete your personal care plan.  Depending on the condition, your plan could have included both over the counter or prescription medications.  If there is a problem please reply once you have received a response from your provider.  Your safety is important to [place].  If you have drug allergies check your prescription carefully.    You can use MyChart to ask questions about today's visit, request a non-urgent call back, or ask for a work or school excuse for 24 hours related to this e-Visit. If it has been greater than 24 hours you will need to follow up with your provider, or enter a new e-Visit to address those concerns. You  will get an e-mail in the next two days asking about your experience.  I hope that your e-visit has been valuable and will speed your recovery. Thank you for using e-visits.   I have spent 5 minutes in review of e-visit questionnaire, review and updating patient chart, medical decision making and response to patient.   Mar Daring, PA-C

## 2023-01-04 ENCOUNTER — Ambulatory Visit: Payer: Medicaid Other | Admitting: Obstetrics and Gynecology

## 2023-01-04 ENCOUNTER — Encounter: Payer: Self-pay | Admitting: Obstetrics and Gynecology

## 2023-01-05 NOTE — Progress Notes (Signed)
Patient did not keep her GYN appointment for 01/04/2023.  Cornelia Copa MD Attending Center for [employer] [facility])

## 2023-03-29 IMAGING — US US PELVIS COMPLETE WITH TRANSVAGINAL
1 series · 13 of 25 positions shown · non-contrast
Comparison: None

CLINICAL DATA: Abnormal uterine bleeding

EXAM:
TRANSABDOMINAL AND TRANSVAGINAL ULTRASOUND OF PELVIS
TECHNIQUE: Both transabdominal and transvaginal ultrasound examinations of the
pelvis were performed. Transabdominal technique was performed for
global imaging of the pelvis including uterus, ovaries, adnexal
regions, and pelvic cul-de-sac. It was necessary to proceed with
endovaginal exam following the transabdominal exam to visualize the
ovaries and adnexa.

[Series 1: us pelvic complete with transvaginal · 13 of 63 slices shown]
[im 1/63]
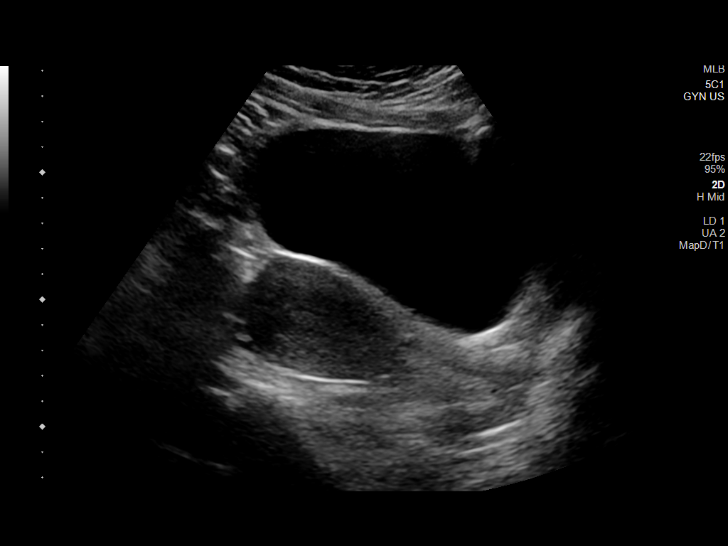
[im 6/63]
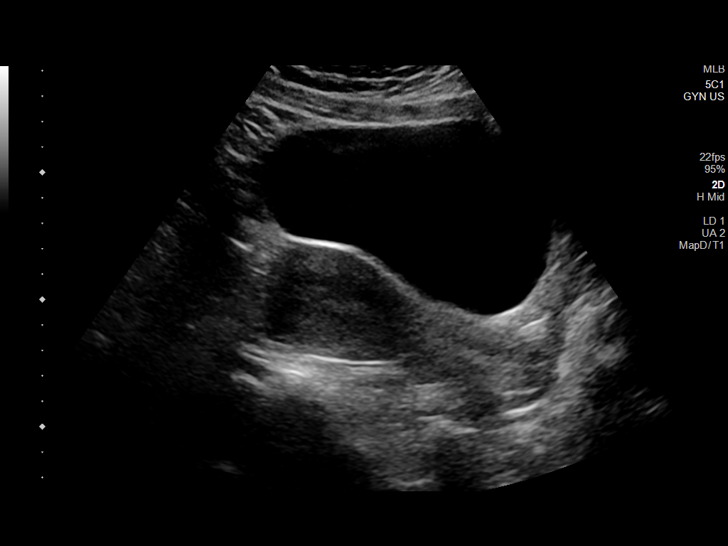
[im 11/63]
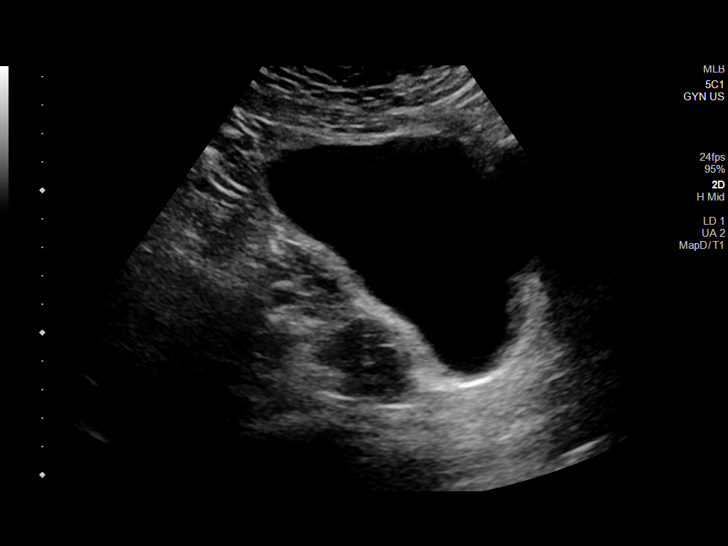
[im 16/63]
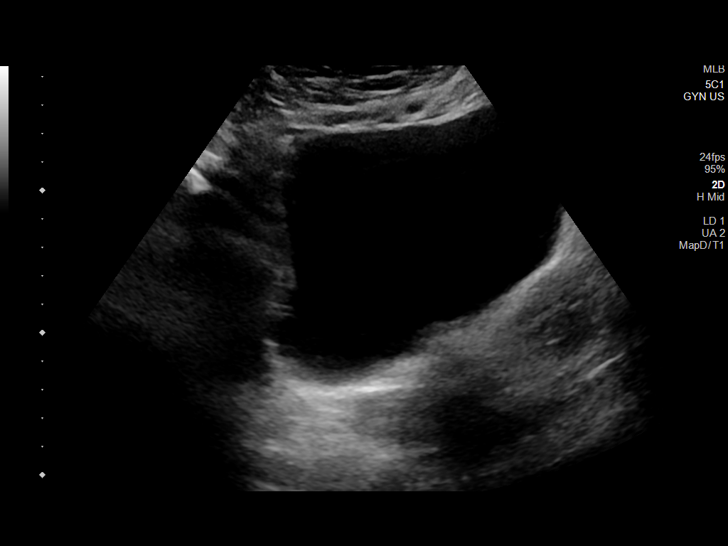
[im 21/63]
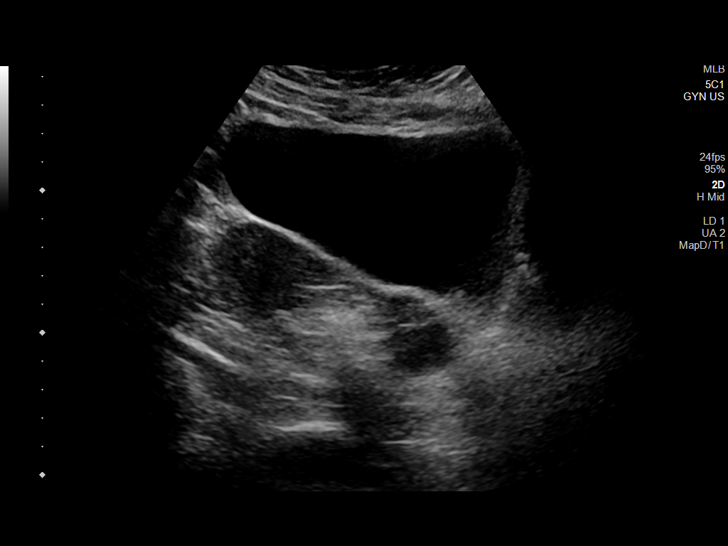
[im 26/63]
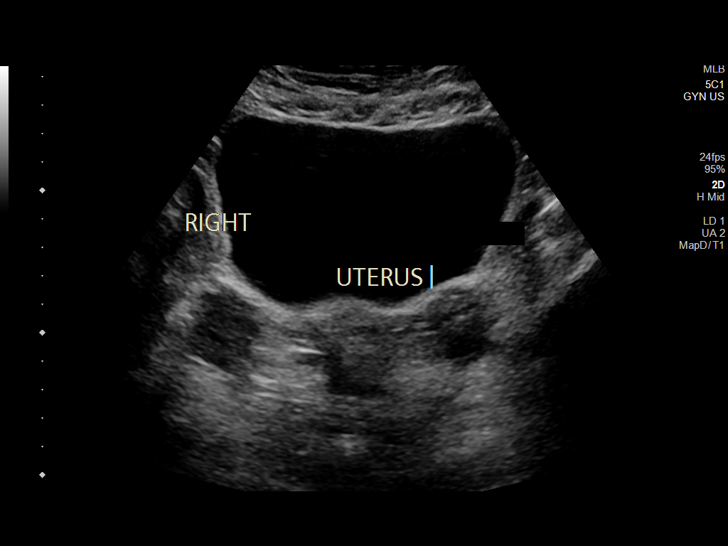
[im 32/63]
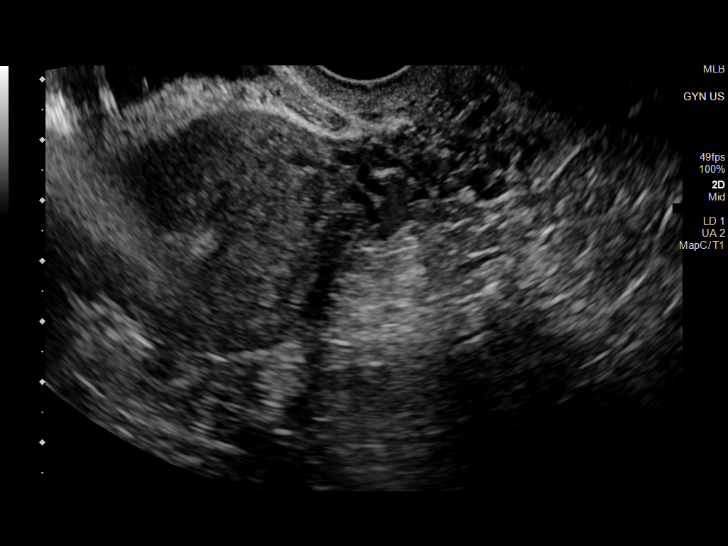
[im 37/63]
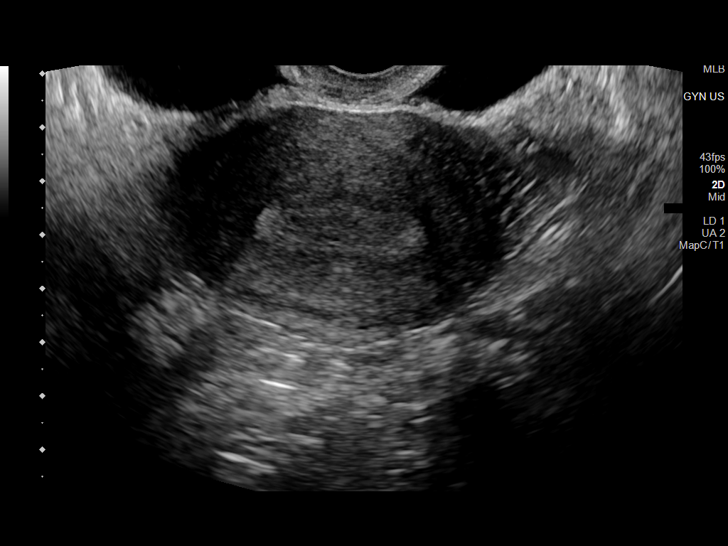
[im 42/63]
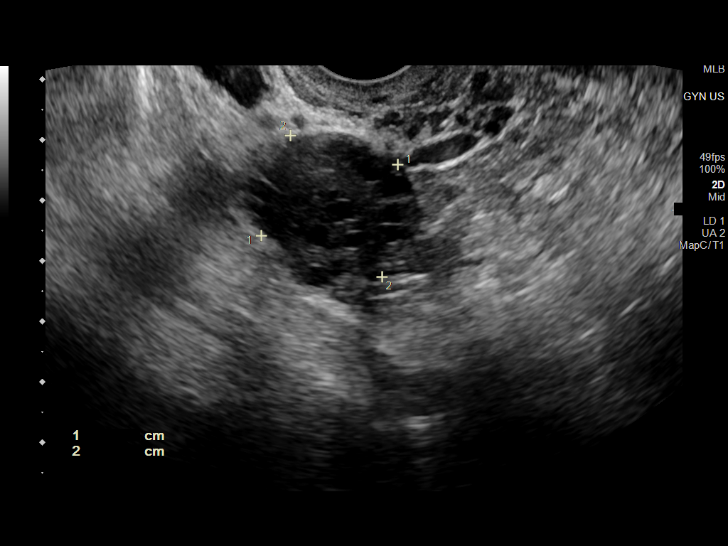
[im 47/63]
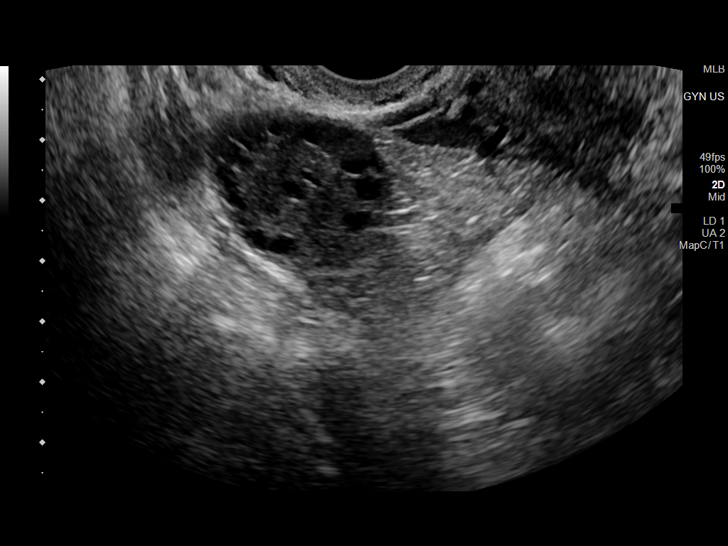
[im 52/63]
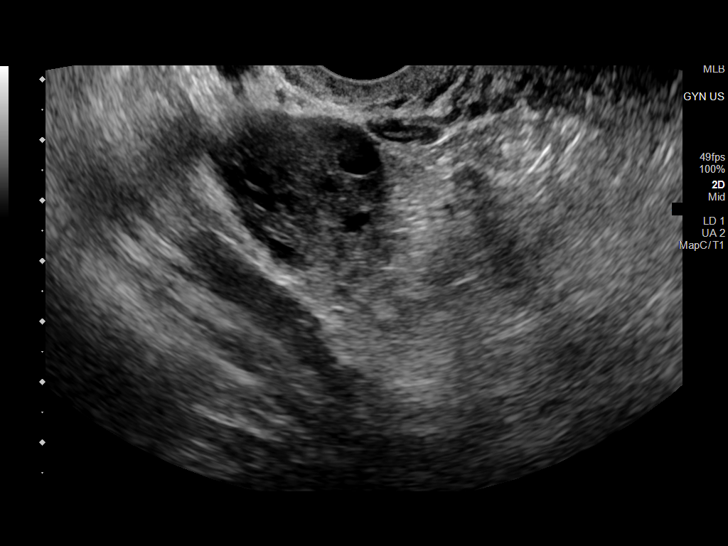
[im 57/63]
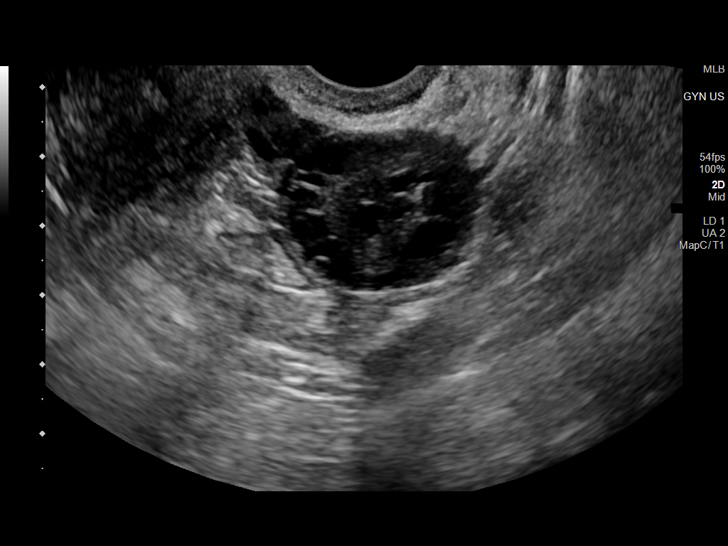
[im 63/63]
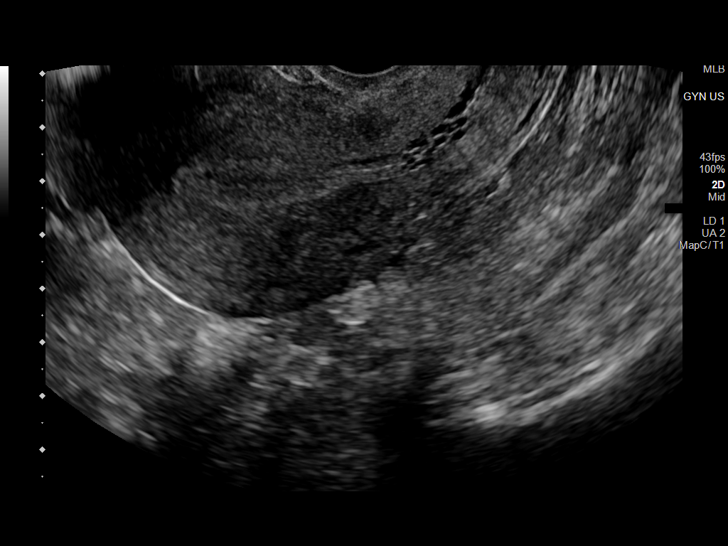

[13 of 25 positions shown; findings below may reference images not displayed]

FINDINGS: Uterus

Measurements: 11.8 x 4.2 x 5.3 cm = volume: 138 mL. No fibroids or
other mass visualized.

Endometrium

Thickness: 9 mm.  No focal abnormality visualized.

Right ovary

Measurements: 2.8 x 2.6 x 2.6 cm = volume: 9.8 mL. Numerous
subcentimeter follicles seen throughout the ovary indicative of
polycystic ovarian syndrome.

Left ovary

Measurements: 3.2 x 2.5 x 2.9 cm = volume: 11.6 mL. Numerous
subcentimeter follicles seen throughout the ovary indicative of
polycystic ovarian syndrome.

Other findings

No abnormal free fluid.
IMPRESSION: 1. Numerous subcentimeter follicle seen within the ovaries
bilaterally, suspicious for PCOS.
2. If bleeding remains unresponsive to hormonal or medical therapy,
sonohysterogram should be considered for focal lesion work-up. (Ref:
Radiological Reasoning: Algorithmic Workup of Abnormal Vaginal
Bleeding with Endovaginal Sonography and Sonohysterography. AJR
[id number]; 191:S68-73)

## 2023-06-19 ENCOUNTER — Other Ambulatory Visit (HOSPITAL_COMMUNITY)
Admission: RE | Admit: 2023-06-19 | Discharge: 2023-06-19 | Disposition: A | Discharge status: Home / Self Care | Payer: 59 | Source: Ambulatory Visit | Attending: Obstetrics and Gynecology | Admitting: Obstetrics and Gynecology

## 2023-06-19 ENCOUNTER — Ambulatory Visit (INDEPENDENT_AMBULATORY_CARE_PROVIDER_SITE_OTHER): Payer: 59 | Admitting: Obstetrics and Gynecology

## 2023-06-19 ENCOUNTER — Encounter: Payer: Self-pay | Admitting: Obstetrics and Gynecology

## 2023-06-19 VITALS — BP 130/89 | HR 86 | Ht 64.0 in | Wt 196.1 lb

## 2023-06-19 DIAGNOSIS — N939 Abnormal uterine and vaginal bleeding, unspecified: Secondary | ICD-10-CM | POA: Diagnosis not present

## 2023-06-19 DIAGNOSIS — Z124 Encounter for screening for malignant neoplasm of cervix: Secondary | ICD-10-CM | POA: Diagnosis not present

## 2023-06-19 DIAGNOSIS — Z1331 Encounter for screening for depression: Secondary | ICD-10-CM

## 2023-06-19 DIAGNOSIS — Z01419 Encounter for gynecological examination (general) (routine) without abnormal findings: Secondary | ICD-10-CM

## 2023-06-19 MED ORDER — METFORMIN HCL 1000 MG PO TABS: ORAL_TABLET | ORAL | 5 refills | Status: AC

## 2023-06-19 MED ORDER — TRANEXAMIC ACID 650 MG PO TABS: 1300.0000 mg | ORAL_TABLET | Freq: Three times a day (TID) | ORAL | 2 refills | Status: AC

## 2023-06-19 NOTE — Progress Notes (Signed)
ANNUAL EXAM Patient name: Terri Bright MRN [id number]  Date of birth: 1985-10-07 Chief Complaint:   Annual Exam  History of Present Illness:   Terri Bright is a 37 y.o. [phone] being seen today for a routine annual exam.  Current complaints: annual, abnormal menses  Menstrual concerns? No  having irregular cycles, hx of PCOS. 2 menses in September, separated by 4 days. 8/27-9/3 menses before that. Previously used [employer] research officer, government. Does not want to prevent pregnancy at this time. Has been a while since she had months without a menses. Bleeding is annoying. May have "gushing" and wear a super plus tampons and pad and still soak through in a few hours.   Breast or nipple changes? No  Contraception use? No  Sexually active? Yes female partner, no pain with intercourse   FH of brast, ovarian , or colon cancer   No LMP recorded.   The pregnancy intention screening data noted above was reviewed. Potential methods of contraception were discussed. The patient elected to proceed with No data recorded.   Last pap No results found for: "DIAGPAP", "HPVHIGH", "ADEQPAP"   H/O abnormal pap: no Last mammogram: n/a.  Last colonoscopy: n/a.      06/19/2023    9:02 AM 03/19/2022    3:49 PM 11/23/2020    2:54 PM  Depression screen PHQ 2/9  Decreased Interest 0 0 0  Down, Depressed, Hopeless 0 0 0  PHQ - 2 Score 0 0 0  Altered sleeping 0 0   Tired, decreased energy 1 0   Change in appetite 0 0   Feeling bad or failure about yourself  0 0   Trouble concentrating 0 0   Moving slowly or fidgety/restless 0 0   Suicidal thoughts 0 0   PHQ-9 Score 1 0         06/19/2023    9:03 AM 03/19/2022    3:50 PM  GAD 7 : Generalized Anxiety Score  Nervous, Anxious, on Edge 0 0  Control/stop worrying 0 0  Worry too much - different things 0 0  Trouble relaxing 0 0  Restless 0 0  Easily annoyed or irritable 0 0  Afraid - awful might happen 0 0  Total GAD 7 Score 0 0     Review of Systems:   Pertinent  items are noted in HPI Denies any headaches, blurred vision, fatigue, shortness of breath, chest pain, abdominal pain, abnormal vaginal discharge/itching/odor/irritation, problems with periods, bowel movements, urination, or intercourse unless otherwise stated above. Pertinent History Reviewed:  Reviewed past medical,surgical, social and family history.  Reviewed problem list, medications and allergies. Physical Assessment:   Vitals:   06/19/23 0859  BP: 130/89  Pulse: 86  Weight: 196 lb 1.6 oz (89 kg)  Height: 5\' 4"  (1.626 m)  Body mass index is 33.66 kg/m.        Physical Examination:   General appearance - well appearing, and in no distress  Mental status - alert, oriented to person, place, and time  Psych:  She has a normal mood and affect  Skin - warm and dry, normal color, no suspicious lesions noted  Chest - effort normal, all lung fields clear to auscultation bilaterally  Heart - normal rate and regular rhythm  Breasts - breasts appear normal, no suspicious masses, no skin or nipple changes or  axillary nodes  Abdomen - soft, nontender, nondistended, no masses or organomegaly  Pelvic -  VULVA: normal appearing vulva with no masses, tenderness or lesions  VAGINA: normal appearing vagina with normal color and discharge, no lesions   CERVIX: normal appearing cervix without discharge or lesions, no CMT  Thin prep pap is done with HR HPV cotesting  UTERUS: uterus is felt to be normal size, shape, consistency and nontender   ADNEXA: No adnexal masses or tenderness noted.  Extremities:  No swelling or varicosities noted  Chaperone present for exam  No results found for this or any previous visit (from the past 24 hour(s)).    Assessment & Plan:   1. Well woman exam with routine gynecological exam - Cervical cancer screening: Discussed guidelines. Pap with HPV collected - GC/CT: accepts - Birth Control:  none - Breast Health: Encouraged self breast awareness/SBE.  Teaching provided.  - F/U 12 months and prn  - Cytology - PAP( [place]) - HIV Antibody (routine testing w rflx) - RPR - Hepatitis C Antibody - Hepatitis B Surface AntiGEN  2. Screening for cervical cancer - Cytology - PAP( [place])  3. Abnormal uterine bleeding (AUB) Patient has abnormal uterine bleeding . She has a normal exam, no evidence of lesions.  Will order abnormal uterine bleeding evaluation labs. Prior EMB (2022) demonstrated proliferative endometrium. Noted that patient had elevated TSH at that time of working up AUB during which she similarly had frequent menses. No other symptoms present.  Will contact patient with these results and plans for further evaluation/management. Patient interested in trying metformin, noted not likely to hurt. Given rx for lysteda to help with flow of menses. Does not want anything that will prevent her from getting pregnant.   - TSH Rfx on Abnormal to Free T4 - Follicle stimulating hormone - Hemoglobin A1c  Orders Placed This Encounter  Procedures   HIV Antibody (routine testing w rflx)   RPR   Hepatitis C Antibody   Hepatitis B Surface AntiGEN   TSH Rfx on Abnormal to Free T4   Follicle stimulating hormone   Hemoglobin A1c    Meds:  Meds ordered this encounter  Medications   metFORMIN (GLUCOPHAGE) 1000 MG tablet    Sig: Take 1/2 tablet by mouth daily for one week. Then increase to one tablet daily for one week.  Then one tablet twice a day.    Dispense:  60 tablet    Refill:  5   tranexamic acid (LYSTEDA) 650 MG TABS tablet    Sig: Take 2 tablets (1,300 mg total) by mouth 3 (three) times daily. Take during menses for a maximum of five days    Dispense:  30 tablet    Refill:  2    Follow-up: No follow-ups on file.  Lorriane Shire, MD 06/19/2023 9:13 AM

## 2023-06-20 ENCOUNTER — Other Ambulatory Visit: Payer: Self-pay | Admitting: Obstetrics and Gynecology

## 2023-06-20 ENCOUNTER — Telehealth: Payer: Self-pay | Admitting: Obstetrics and Gynecology

## 2023-06-20 DIAGNOSIS — E119 Type 2 diabetes mellitus without complications: Secondary | ICD-10-CM

## 2023-06-20 DIAGNOSIS — E063 Autoimmune thyroiditis: Secondary | ICD-10-CM

## 2023-06-20 DIAGNOSIS — N939 Abnormal uterine and vaginal bleeding, unspecified: Secondary | ICD-10-CM

## 2023-06-20 LAB — HEPATITIS B SURFACE ANTIGEN: Hepatitis B Surface Ag: NEGATIVE

## 2023-06-20 LAB — RPR: RPR Ser Ql: NONREACTIVE

## 2023-06-20 LAB — HIV ANTIBODY (ROUTINE TESTING W REFLEX): HIV Screen 4th Generation wRfx: NONREACTIVE

## 2023-06-20 LAB — HEMOGLOBIN A1C
Est. average glucose Bld gHb Est-mCnc: 146 mg/dL
Hgb A1c MFr Bld: 6.7 % — ABNORMAL HIGH (ref 4.8–5.6)

## 2023-06-20 LAB — TSH RFX ON ABNORMAL TO FREE T4: TSH: 20.5 u[IU]/mL — ABNORMAL HIGH (ref 0.450–4.500)

## 2023-06-20 LAB — HEPATITIS C ANTIBODY: Hep C Virus Ab: NONREACTIVE

## 2023-06-20 LAB — T4F: T4,Free (Direct): 0.71 ng/dL — ABNORMAL LOW (ref 0.82–1.77)

## 2023-06-20 LAB — FOLLICLE STIMULATING HORMONE: FSH: 5.5 m[IU]/mL

## 2023-06-20 NOTE — Telephone Encounter (Signed)
Called patient and confirmed ID x2.   Noted that labs demonstrate abnormal thyroid function. She reports being on thyroid medication previously and when on a lower dose she felt fine, but on a higher dose she felt sluggish, so she just stopped taking her medication. Reviewed that abnormal thyroid function can cause changes in menses and recommend restarting thyroid medication. Additionally, A1c is elevated and diagnostic of diabetes. For both thyroid dysfunction and diabetes, referral placed for endocrinologist. Verbalized understanding and questions answered.   Lorriane Shire, MD, FACOG Minimally Invasive Gynecologic Surgery  Obstetrics and Gynecology, [facility] for [facility], [facility] 06/20/2023

## 2023-06-21 LAB — CYTOLOGY - PAP
Chlamydia: NEGATIVE
Comment: NEGATIVE
Comment: NEGATIVE
Comment: NEGATIVE
Comment: NORMAL
Diagnosis: NEGATIVE
High risk HPV: NEGATIVE
Neisseria Gonorrhea: NEGATIVE
Trichomonas: NEGATIVE

## 2024-06-10 ENCOUNTER — Encounter

## 2024-06-10 ENCOUNTER — Encounter (HOSPITAL_COMMUNITY): Payer: Self-pay | Admitting: Emergency Medicine

## 2024-06-10 ENCOUNTER — Telehealth: Payer: Self-pay | Admitting: Physician Assistant

## 2024-06-10 ENCOUNTER — Other Ambulatory Visit: Payer: Self-pay

## 2024-06-10 ENCOUNTER — Emergency Department (HOSPITAL_COMMUNITY)
Admission: EM | Admit: 2024-06-10 | Discharge: 2024-06-10 | Discharge status: Against Medical Advice | Source: Ambulatory Visit | Attending: Emergency Medicine | Admitting: Emergency Medicine

## 2024-06-10 DIAGNOSIS — Z5321 Procedure and treatment not carried out due to patient leaving prior to being seen by health care provider: Secondary | ICD-10-CM | POA: Diagnosis not present

## 2024-06-10 DIAGNOSIS — N920 Excessive and frequent menstruation with regular cycle: Secondary | ICD-10-CM

## 2024-06-10 DIAGNOSIS — N939 Abnormal uterine and vaginal bleeding, unspecified: Secondary | ICD-10-CM | POA: Diagnosis present

## 2024-06-10 LAB — URINALYSIS, ROUTINE W REFLEX MICROSCOPIC
Bilirubin Urine: NEGATIVE
Glucose, UA: 150 mg/dL — AB
Ketones, ur: NEGATIVE mg/dL
Nitrite: POSITIVE — AB
Protein, ur: 100 mg/dL — AB
Specific Gravity, Urine: 1.02 (ref 1.005–1.030)
pH: 6 (ref 5.0–8.0)

## 2024-06-10 LAB — CBC WITH DIFFERENTIAL/PLATELET
Abs Immature Granulocytes: 0.06 K/uL (ref 0.00–0.07)
Basophils Absolute: 0.1 K/uL (ref 0.0–0.1)
Basophils Relative: 0 %
Eosinophils Absolute: 0.2 K/uL (ref 0.0–0.5)
Eosinophils Relative: 2 %
HCT: 43.5 % (ref 36.0–46.0)
Hemoglobin: 14.2 g/dL (ref 12.0–15.0)
Immature Granulocytes: 1 %
Lymphocytes Relative: 37 %
Lymphs Abs: 4.1 K/uL — ABNORMAL HIGH (ref 0.7–4.0)
MCH: 25.7 pg — ABNORMAL LOW (ref 26.0–34.0)
MCHC: 32.6 g/dL (ref 30.0–36.0)
MCV: 78.7 fL — ABNORMAL LOW (ref 80.0–100.0)
Monocytes Absolute: 0.6 K/uL (ref 0.1–1.0)
Monocytes Relative: 5 %
Neutro Abs: 6.1 K/uL (ref 1.7–7.7)
Neutrophils Relative %: 55 %
Platelets: 432 K/uL — ABNORMAL HIGH (ref 150–400)
RBC: 5.53 MIL/uL — ABNORMAL HIGH (ref 3.87–5.11)
RDW: 13.3 % (ref 11.5–15.5)
Smear Review: NORMAL
WBC: 11.1 K/uL — ABNORMAL HIGH (ref 4.0–10.5)
nRBC: 0 % (ref 0.0–0.2)

## 2024-06-10 LAB — HCG, SERUM, QUALITATIVE: Preg, Serum: NEGATIVE

## 2024-06-10 NOTE — Patient Instructions (Signed)
  Terri Bright, thank you for joining Terri Velma Lunger, PA-C for today's virtual visit.  While this provider is not your primary care provider (PCP), if your PCP is located in our provider database this encounter information will be shared with them immediately following your visit.   A [facility] MyChart account gives you access to today's visit and all your visits, tests, and labs performed at [facility]  click here if you don't have a [facility] MyChart account or go to [email or web address]  Consent: (Patient) Terri Bright provided verbal consent for this virtual visit at the beginning of the encounter.  Current Medications:  Current Outpatient Medications:    albuterol  (VENTOLIN  HFA) 108 (90 Base) MCG/ACT inhaler, Inhale 1-2 puffs into the lungs every 6 (six) hours as needed., Disp: 8 g, Rfl: 0   fluticasone  (FLONASE ) 50 MCG/ACT nasal spray, Place 2 sprays into both nostrils daily., Disp: 16 g, Rfl: 0   metFORMIN  (GLUCOPHAGE ) 1000 MG tablet, Take 1/2 tablet by mouth daily for one week. Then increase to one tablet daily for one week.  Then one tablet twice a day., Disp: 60 tablet, Rfl: 5   tranexamic acid  (LYSTEDA ) 650 MG TABS tablet, Take 2 tablets (1,300 mg total) by mouth 3 (three) times daily. Take during menses for a maximum of five days, Disp: 30 tablet, Rfl: 2   Medications ordered in this encounter:  No orders of the defined types were placed in this encounter.    *If you need refills on other medications prior to your next appointment, please contact your pharmacy*  Follow-Up: Call back or seek an in-person evaluation if the symptoms worsen or if the condition fails to improve as anticipated.  [facility] [phone]  Other Instructions Please hydrate and rest. Go be evaluated in person ASAP today as discussed. Since the Urgent Care next to your office  is [facility], they should be able to see our notes to know what we already  discussed.   If you have been instructed to have an in-person evaluation today at a local Urgent Care facility, please use the link below. It will take you to a list of all of our available [facility], including address, phone number and hours of operation. Please do not delay care.  [facility]  If you or a family member do not have a primary care provider, use the link below to schedule a visit and establish care. When you choose a [facility] primary care physician or advanced practice provider, you gain a long-term partner in health. Find a Primary Care Provider  Learn more about [facility]'s in-office and virtual care options: [facility] Get Care Now

## 2024-06-10 NOTE — ED Triage Notes (Signed)
 Patient presents post virtual visit. She is on day 5 of her period and has seen excessive bleeding and clotting. Medication has not helped. She also also had a bad headache, feels tires and feels liek she is having hot flashes at night. While at work today, around 12 noon, she noticed clots gushing out into the toilet.     HX PCOS

## 2024-06-10 NOTE — Progress Notes (Signed)
 Virtual Visit Consent   Terri Bright, you are scheduled for a virtual visit with a [facility] provider today. Just as with appointments in the office, your consent must be obtained to participate. Your consent will be active for this visit and any virtual visit you may have with one of our providers in the next 365 days. If you have a MyChart account, a copy of this consent can be sent to you electronically.  As this is a virtual visit, video technology does not allow for your provider to perform a traditional examination. This may limit your provider's ability to fully assess your condition. If your provider identifies any concerns that need to be evaluated in person or the need to arrange testing (such as labs, EKG, etc.), we will make arrangements to do so. Although advances in technology are sophisticated, we cannot ensure that it will always work on either your end or our end. If the connection with a video visit is poor, the visit may have to be switched to a telephone visit. With either a video or telephone visit, we are not always able to ensure that we have a secure connection.  By engaging in this virtual visit, you consent to the provision of healthcare and authorize for your insurance to be billed (if applicable) for the services provided during this visit. Depending on your insurance coverage, you may receive a charge related to this service.  I need to obtain your verbal consent now. Are you willing to proceed with your visit today? Terri Bright has provided verbal consent on 06/10/2024 for a virtual visit (video or telephone). Terri Bright, [place]  Date: 06/10/2024 1:44 PM   Virtual Visit via Video Note   I, Terri Bright, connected with  Terri Bright  ([id number], 17-Feb-1986) on 06/10/24 at  1:45 PM EDT by a video-enabled telemedicine application and verified that I am speaking with the correct person using two identifiers.  Location: Patient: Virtual Visit Location  Patient: Home Provider: Virtual Visit Location Provider: Home Office   I discussed the limitations of evaluation and management by telemedicine and the availability of in person appointments. The patient expressed understanding and agreed to proceed.    History of Present Illness: Terri Bright is a 38 y.o. who identifies as a female who was assigned female at birth, and is being seen today for 5 days of menstrual bleeding. Notes starting Saturday with her usual amount of flow. Some cramping. Sunday into Monday noting substantial increase in bleeding, soaking through pads in < 1 hour. Notes yesterday flow slowed down some with having to change pads every 3 hours or so. This morning was feeling fine but as of midday with worsening bleeding again. Is currently wearing both a pad and a tampon. Notes pounding headache and fatigue. Denies SOB. Notes she took her Lysteda  earlier without any improvement in symptoms.   HPI: HPI  Problems:  Patient Active Problem List   Diagnosis Date Noted   Supervision of high risk pregnancy, antepartum 02/20/2022   Hashimoto's disease 04/24/2021   Abnormal uterine bleeding (AUB) 04/24/2021    Allergies:  Allergies  Allergen Reactions   Bactrim [Sulfamethoxazole-Trimethoprim]    Medications:  Current Outpatient Medications:    albuterol  (VENTOLIN  HFA) 108 (90 Base) MCG/ACT inhaler, Inhale 1-2 puffs into the lungs every 6 (six) hours as needed., Disp: 8 g, Rfl: 0   fluticasone  (FLONASE ) 50 MCG/ACT nasal spray, Place 2 sprays into both nostrils daily., Disp: 16 g, Rfl: 0  metFORMIN  (GLUCOPHAGE ) 1000 MG tablet, Take 1/2 tablet by mouth daily for one week. Then increase to one tablet daily for one week.  Then one tablet twice a day., Disp: 60 tablet, Rfl: 5   tranexamic acid  (LYSTEDA ) 650 MG TABS tablet, Take 2 tablets (1,300 mg total) by mouth 3 (three) times daily. Take during menses for a maximum of five days, Disp: 30 tablet, Rfl:  2  Observations/Objective: Patient is well-developed, well-nourished in no acute distress.  Resting comfortably at home.  Head is normocephalic, atraumatic.  No labored breathing.  Speech is clear and coherent with logical content.  Patient is alert and oriented at baseline.   Assessment and Plan: 1. Menorrhagia with regular cycle (Primary)  Not responsive to her Lysteda . Continued for 5 days with substantial headache and fatigue. Needs in person evaluation for exam, assessment of vitals, labs and acute treatment. Will likely need OP follow-up with GYN for ongoing prevention/management. She agrees to be evaluated as soon as her boss returns from lunch. There is an UC across the street from where she works. EMS precautions reviewed.  Follow Up Instructions: I discussed the assessment and treatment plan with the patient. The patient was provided an opportunity to ask questions and all were answered. The patient agreed with the plan and demonstrated an understanding of the instructions.  A copy of instructions were sent to the patient via MyChart unless otherwise noted below.   The patient was advised to call back or seek an in-person evaluation if the symptoms worsen or if the condition fails to improve as anticipated.    Terri Velma Lunger, PA-C

## 2024-06-11 ENCOUNTER — Telehealth: Admitting: Physician Assistant

## 2024-06-11 DIAGNOSIS — N920 Excessive and frequent menstruation with regular cycle: Secondary | ICD-10-CM

## 2024-06-11 NOTE — Progress Notes (Signed)
  Because we cannot provide adequate assessment and treatment virtually and due to ongoing symptoms despite the tranexamic acid , I feel your condition warrants further evaluation and I recommend that you be seen in a face-to-face visit. If you are hesitant to return to the ER, please at least be evaluated at in-person care (see list below)   NOTE: There will be NO CHARGE for this E-Visit   If you are having a true medical emergency, please call 911.     For an urgent face to face visit, [facility] has multiple urgent care centers for your convenience.  Click the link below for the full list of locations and hours, walk-in wait times, appointment scheduling options and driving directions:  Urgent Care - [place], [place], [place], [place], [place], [place]  [place]     Your MyChart E-visit questionnaire answers were reviewed by a board certified advanced clinical practitioner to complete your personal care plan based on your specific symptoms.    Thank you for using e-Visits.

## 2024-07-02 ENCOUNTER — Ambulatory Visit: Admitting: Obstetrics and Gynecology

## 2024-07-02 ENCOUNTER — Encounter: Payer: Self-pay | Admitting: Obstetrics and Gynecology

## 2024-07-02 ENCOUNTER — Other Ambulatory Visit: Payer: Self-pay

## 2024-07-02 VITALS — BP 106/74 | HR 85 | Wt 195.5 lb

## 2024-07-02 DIAGNOSIS — E063 Autoimmune thyroiditis: Secondary | ICD-10-CM | POA: Diagnosis not present

## 2024-07-02 DIAGNOSIS — E119 Type 2 diabetes mellitus without complications: Secondary | ICD-10-CM | POA: Diagnosis not present

## 2024-07-02 DIAGNOSIS — N939 Abnormal uterine and vaginal bleeding, unspecified: Secondary | ICD-10-CM | POA: Diagnosis not present

## 2024-07-02 DIAGNOSIS — Z1331 Encounter for screening for depression: Secondary | ICD-10-CM

## 2024-07-02 NOTE — Progress Notes (Signed)
 GYNECOLOGY VISIT  Patient name: Terri Bright MRN [id number]  Date of birth: February 17, 1986 Chief Complaint:   Gynecologic Exam  History:  Terri Bright Normal for a few months but fairly light. 10/4-10/14 w/ bad period and went to ED. Passing a lot of blood clots and heavy like that from day 2-6. Lots of cramiping, minimal relief with tylenol. Feeling like she may pass out when it occurs. Blood work was completed in ED but left prior to results returning. Menses before were monthly and fairly light. Was taking lysteda  and wasn't helping. Not taking mefotrmin due to GI side effects Had not been able to follow up about thyroid  - has not really liked levothyroxine  in the past when taking it  Was on prednisone  during the time of ED visit  Had been going to the bathroom every hour Prior menses was light to normal, last for 2 weeks and then this menses was 9-10 days Needed letrozole  to get pregnant previously    The following portions of the patient's history were reviewed and updated as appropriate: allergies, current medications, past family history, past medical history, past social history, past surgical history and problem list.   Health Maintenance:   Last pap     Component Value Date/Time   DIAGPAP  06/19/2023 0927    - Negative for intraepithelial lesion or malignancy (NILM)   HPVHIGH Negative 06/19/2023 0927   ADEQPAP  06/19/2023 0927    Satisfactory for evaluation; transformation zone component PRESENT.    Health Maintenance  Topic Date Due   Hepatitis B Vaccine (1 of 3 - 19+ 3-dose series) Never done   HPV Vaccine (1 - 3-dose SCDM series) Never done   Flu Shot  Never done   COVID-19 Vaccine (3 - 2025-26 season) 05/04/2024   DTaP/Tdap/Td vaccine (2 - Td or Tdap) 12/08/2026   Pap with HPV screening  06/18/2028   Hepatitis C Screening  Completed   HIV Screening  Completed   Pneumococcal Vaccine  Aged Out   Meningitis B Vaccine  Aged Out      Review of Systems:   Pertinent items are noted in HPI. Comprehensive review of systems was otherwise negative.   Objective:  Physical Exam BP 106/74   Pulse 85   Wt 195 lb 8 oz (88.7 kg)   LMP 06/06/2024 (Exact Date)   BMI 33.56 kg/m    Physical Exam Vitals and nursing note reviewed.  Constitutional:      Appearance: Normal appearance.  HENT:     Head: Normocephalic and atraumatic.  Pulmonary:     Effort: Pulmonary effort is normal.  Skin:    General: Skin is warm and dry.  Neurological:     General: No focal deficit present.     Mental Status: She is alert.  Psychiatric:        Mood and Affect: Mood normal.        Behavior: Behavior normal.        Thought Content: Thought content normal.        Judgment: Judgment normal.        Assessment & Plan:  1. Abnormal uterine bleeding (AUB) (Primary) 2. Hashimoto's disease 3. Type 2 diabetes mellitus without complication, without long-term current use of insulin (HCC) Suspect multi-factorial contributions to AUB - anovulation and uncontrolled DM2 and Hashimoto's. Patient desires pregnancy. Emphasized the importance of addressing current co morbidities as having them remain uncontrolled will decrease  likelihood of spontaneous conception and if conception were to occur,  increase risk of miscarriage and overall make pregnancy high risk. New referral placed for endocrine. Labs today to reassess thyroid  and glycemic status. Pelvic US  to assess any structural contributions, particularly given conception goals. Keep appointment with PCP, will likely need to resume anti-hyperglycemic for DM2.   - Thyroid  Panel With TSH - Hemoglobin A1c - Basic Metabolic Panel (BMET) - Ambulatory referral to Endocrinology   Carter Quarry, MD Minimally Invasive Gynecologic Surgery Center for [facility], [facility]

## 2024-07-03 ENCOUNTER — Ambulatory Visit: Payer: Self-pay | Admitting: Obstetrics and Gynecology

## 2024-07-03 DIAGNOSIS — E119 Type 2 diabetes mellitus without complications: Secondary | ICD-10-CM

## 2024-07-03 DIAGNOSIS — N939 Abnormal uterine and vaginal bleeding, unspecified: Secondary | ICD-10-CM

## 2024-07-03 DIAGNOSIS — E063 Autoimmune thyroiditis: Secondary | ICD-10-CM

## 2024-07-03 LAB — HEMOGLOBIN A1C
Est. average glucose Bld gHb Est-mCnc: 192 mg/dL
Hgb A1c MFr Bld: 8.3 % — ABNORMAL HIGH (ref 4.8–5.6)

## 2024-07-03 LAB — BASIC METABOLIC PANEL WITH GFR
BUN/Creatinine Ratio: 16 (ref 9–23)
BUN: 9 mg/dL (ref 6–20)
CO2: 21 mmol/L (ref 20–29)
Calcium: 9.9 mg/dL (ref 8.7–10.2)
Chloride: 102 mmol/L (ref 96–106)
Creatinine, Ser: 0.57 mg/dL (ref 0.57–1.00)
Glucose: 122 mg/dL — ABNORMAL HIGH (ref 70–99)
Potassium: 4.8 mmol/L (ref 3.5–5.2)
Sodium: 140 mmol/L (ref 134–144)
eGFR: 121 mL/min/1.73 (ref >59)

## 2024-07-03 LAB — THYROID PANEL WITH TSH
Free Thyroxine Index: 1.6 (ref 1.2–4.9)
T3 Uptake Ratio: 25 % (ref 24–39)
T4, Total: 6.2 ug/dL (ref 4.5–12.0)
TSH: 11.8 u[IU]/mL — ABNORMAL HIGH (ref 0.450–4.500)

## 2024-07-03 LAB — THYROID PEROXIDASE ANTIBODY: Thyroperoxidase Ab SerPl-aCnc: 84 [IU]/mL — ABNORMAL HIGH (ref 0–34)

## 2024-07-03 LAB — FOLLICLE STIMULATING HORMONE: FSH: 5.2 m[IU]/mL

## 2024-07-03 MED ORDER — MEDROXYPROGESTERONE ACETATE 10 MG PO TABS: 10.0000 mg | ORAL_TABLET | Freq: Every day | ORAL | 2 refills | Status: AC

## 2024-07-08 NOTE — Progress Notes (Signed)
 Name: Terri Bright Date of visit: 07/09/24  Chief Complaint   Chief Complaint  Patient presents with  . Annual Exam    Last Pap was 2024    Subjective  Terri Bright is a 38 y.o. female who presents today at [facility] to have annual physical exam.     History of Present Illness The patient presents for a physical exam.  She had a Pap smear last year and is not currently using any form of contraception. She is married and has one child. She reports no suicidal or homicidal ideations and declines the influenza vaccine today. Blood work was done last week, including thyroid  and A1c tests, and she is still bruised from the last blood draw. She is unsure if a lipid panel was included. Her diet is devoid of fried foods and primarily consists of vegetables, with an effort to reduce pasta and rice intake.  She was previously on metformin  1000 mg for diabetes management but discontinued it due to gastrointestinal discomfort. Her A1c was reported as 8.3.  She has a history of hypothyroidism and Hashimoto's disease. She was previously prescribed levothyroxine  at a lower dose, which she tolerated well. However, an increase in dosage led to feelings of lethargy and difficulty in rising. She is not currently on levothyroxine . She recalls feeling energetic and experiencing weight loss when on a 100 mcg dose of levothyroxine .  She has been diagnosed with polycystic ovary syndrome (PCOS) via ultrasound by her gynecologist. She was recommended Provera  for PCOS but expresses apprehension about taking it. She did not get her first period until she was about 70 and has always had irregular periods, often skipping 8 months at a time when she was younger. She started Depo-Provera  in 2009 when she got married. After the Depo-Provera , she went from a size 7 to a size 12 or 14 within months. She did 3 rounds of Depo-Provera  and quit after that. She has not been on any type of birth control since.  Her periods are either very light or they will come and last 9 days. Last month, it was a little pregnancy. She is not trying to do birth control because at some point her husband and she are trying, they are not actively trying, but if it happens, it happens. She did end up pregnant in 2023 spontaneously but had a miscarriage at 8 weeks. She stopped going to her old gynecologist on [facility] because they did not take her seriously when she said it was a high-risk pregnancy. She switched to a completely different provider. She is having difficulty losing weight. Her cycles are regular. They come every month in the first week of the month. She started today actually.  She is currently on hydroxyzine as needed for pruritus and Claritin. She was previously on prednisone  for a week, which effectively alleviated her itching. She is awaiting a call from an allergist. She reports no changes in detergent or soap use or dietary habits. She has a history of severe allergic reactions since childhood, including one incident where her eyes swelled up significantly, prompting a visit from [facility]. Despite seeing an allergist as a child, the cause of her reactions remains undetermined. She resides in an older house with numerous trees in the yard and uses air purifiers. She notes that her current symptoms are mild and took hydroxyzine last night but did not take Claritin this morning. She observes that her symptoms tend to worsen when she is  upset with her husband.  She has chronic sciatica and when they did the MRI, they found polycystic ovaries.  Marital Status: Married Diet: Devoid of fried foods, primarily vegetables, reducing pasta and rice intake Alcohol: None Tobacco: None Living Condition: Resides in an older house with numerous trees in the yard, uses air purifiers  GYNECOLOGICAL HISTORY: - Age of Menarche: 64 - Last Menstrual Period: 07/2024 - Frequency and Flow: Regular, either very light or lasting 9  days     Behavioral Health Screening  Patient Health Questionnaire-2 Score: 0 (07/09/2024 11:15 AM)      Patient's Depression screening is Negative  Health Maintenance:  - (>18) Depression screen: The patient denies any present symptoms of depression or anxiety. - (>35) Lipid disorders screening: yes - ordered today.  - (<50) Contraception: none - Cervical cancer screening: approximate date 2024 and was normal - Breast Cancer screening: n/a  Once-lifetime HIV & HepC: No results found for: HIV, HEPCAB   Vaccines:  Immunization History  Administered Date(s) Administered  . Moderna SARS-CoV-2 Primary Series 12+ yrs 12/05/2019, 01/02/2020  . TDAP VACCINE (BOOSTRIX,ADACEL) 7Y+ 12/07/2016    - Tdap: up-to-date. - Influenza vaccine: advised to get vaccine during influenza season Oct-May. - (>65, 19-65 DM/COPD, Ast/CHF) Pneumonia vaccine: N/A.      Current Outpatient Medications  Medication Instructions  . hydrOXYzine (ATARAX) 25 mg, oral, 3 times daily PRN  . levothyroxine  (SYNTHROID ) 100 mcg, oral, Daily at 0600  . loratadine (CLARITIN) 10 mg, oral, Daily  . metFORMIN  (GLUCOPHAGE  XR) 750 mg, oral, Daily with breakfast  . Mounjaro 2.5 mg, subcutaneous, Every 7 days  . rosuvastatin (CRESTOR) 5 mg, oral, Daily  . tranexamic acid  (LYSTEDA ) 1,300 mg    PAST MEDICAL, SOCIAL & FAMILY HISTORY:   Medical History[1] Surgical History[2] Family History[3] Social History[4]   Allergies: Sulfamethoxazole-trimethoprim  IMMUNIZATIONS/ HEALTH STATUS    Immunization History  Administered Date(s) Administered  . Moderna SARS-CoV-2 Primary Series 12+ yrs 12/05/2019, 01/02/2020  . TDAP VACCINE (BOOSTRIX,ADACEL) 7Y+ 12/07/2016     Health Maintenance Status       Date Due Completion Dates   Diabetes:  eGFR for Kidney Evaluation Never done ---   Diabetes:  Quantitative uACR for Kidney Evaluation Never done ---   Diabetes: Retinopathy Screening Combo Never done ---    Varicella Vaccines (1 of 2 - 13+ 2-dose series) Never done ---   HIV Screening Never done ---   Hepatitis C Screening Never done ---   Hepatitis B Vaccines (1 of 3 - 19+ 3-dose series) Never done ---   Cervical Cancer Screening Never done ---   HPV Vaccines (1 - 3-dose SCDM series) Never done ---   Influenza Vaccine (1) Never done ---   COVID-19 Vaccine (3 - 2025-26 season) 07/09/2025 (Originally 05/04/2024) 01/02/2020, 12/05/2019   Diabetes: Hemoglobin A1C 12/31/2024 07/02/2024   Depression Screening 06/02/2025 06/02/2024   Comprehensive Annual Visit 07/09/2025 07/09/2024, 06/19/2023   DTaP/Tdap/Td Vaccines (2 - Td or Tdap) 12/08/2026 12/07/2016   Adult RSV (50+ Years or Pregnancy) (1 - 1-dose 75+ series) 08/12/2063 ---       ROS  Other systems reviewed and negative. No other complaints.  Objective   Vitals:   07/09/24 1115  BP: 123/75  Pulse: 83  Temp: 96.4 F (35.8 C)  SpO2: 97%      Physical Exam: Gen: appears comfortable HEENT: PERRLA, clear oropharynx, no visible thyromegaly.  Cardio: Normal rate, regular rhythm.  No appreciable murmurs.  Resp: Lungs clear to auscultation  bilaterally GI: nt/nd. No palpable masses MSK:  No gross abnormality Skin: Warm and dry, no visible rashes or lesions on exposed skin Neurologic:  No focal deficits. Appropriate gait Psych: Alert and oriented x 3, cooperative.  Euthymic mood   ASSESSMENT & PLAN   Orders Placed This Encounter  Procedures  . Lipid Panel  . Hepatitis C Virus (HCV) Antibody Screen With Confirmation  . HIV Screen with Reflex to Confirmation  . HIV Screen with Reflex to Confirmation  . Hepatitis C Virus (HCV) Antibody Screen With Confirmation  . Urinalysis with Reflex to Microscopic   Orders Placed This Encounter  Medications  . metFORMIN  (GLUCOPHAGE  XR) 750 mg 24 hr tablet    Sig: Take 1 tablet (750 mg total) by mouth daily with breakfast.    Dispense:  90 tablet    Refill:  3  . levothyroxine  (SYNTHROID ) 100 mcg  tablet    Sig: Take 1 tablet (100 mcg total) by mouth in the morning.    Dispense:  90 tablet    Refill:  3  . tirzepatide (Mounjaro) 2.5 mg/0.5 mL subcutaneous pen injector    Sig: Inject 2.5 mg under the skin every 7 days.    Dispense:  3 mL    Refill:  3  . rosuvastatin (CRESTOR) 5 mg tablet    Sig: Take 1 tablet (5 mg total) by mouth daily.    Dispense:  90 tablet    Refill:  3       Assessment & Plan Annual physical exam    - age appropriate Health Maintenance reviewed and updated accordingly - Routine age appropriate blood work obtained  - maintenance of healthy diet including emphasis on intake of vegetables, fruits, legumes, nuts, whole grains and fish.    Hypothyroidism due to Hashimoto thyroiditis    Not on any treatment at this time as previously levothyroxine .  Reviewed recent thyroid  panel available in chart which showed elevated TSH of 20.5 and low T4 of 0.71 back from October 2024, most recently had it rechecked on 07/02/2024 which showed TSH down to 11.8 with normal T4 and TPO of 84. -Treatment indicated if TSH is greater than 10, trying to conceive or positive thyroid  peroxidase antibody. -Start levothyroxine  100 mcg daily--take on fasting stomach and wait 30 minutes before eating.  Will reevaluate in 2 months.      Chronic urticaria    Chronic symptoms, exacerbated by heat and stress.  Denies any new foods, change in dietary status, household cleaning products, or medications.  Saw urgent care for this few months ago and was started on a steroid taper in addition to hydroxyzine as needed.  She was also referred to an allergist. - Awaiting appointment scheduling with allergy - Continue hydroxyzine as needed - Continue daily antihistamine such as Claritin or Zyrtec - If severe symptoms associated with respiratory distress or swelling please seek immediate   Screening for lipid disorders  Orders: .  Lipid Panel; Future   Type 2 diabetes mellitus  without complication, without long-term current use of insulin    (CMD) Class 1 obesity without serious comorbidity with body mass index (BMI) of 33.0 to 33.9 in adult, unspecified obesity type  No results found for: HGBA1C No results found for: GLUF, MICROALBUR, LDLCALC, CREATININE   Wt Readings from Last 3 Encounters:  07/09/24 88.5 kg (195 lb 3.2 oz)  06/02/24 88.9 kg (196 lb)    Orders: .  tirzepatide (Mounjaro) 2.5 mg/0.5 mL subcutaneous pen injector; Inject 2.5 mg  under the skin every 7 days.  A1c obtained at [facility] on 07/02/2024 elevated at 8.3.  She was started on metformin  1000 mg twice daily but has not been taking it due to causing GI irritation. - Discussed starting extended release metformin  750 mg once daily which should be more tolerable -Start Mounjaro 2.5 mg once weekly, escalate as tolerated monthly.  If not covered by insurance, can consider oral phentermine to help with weight loss - Check lipid panel, start moderate intensity statin for primary prevention of CVD , prescription for Crestor 5 mg once daily has been provided - Maintain healthy diet, Mediterranean/DASH diet in combination with daily exercise 3-4x/week such as brisk walking as tolerated    UTI symptoms  Orders: .  Urinalysis with Reflex to Microscopic; Future    PCOS (polycystic ovarian syndrome)    - She has been diagnosed with PCOS via ultrasound by her gynecologist. - The use of Depo-Provera  for PCOS was discussed and deemed not recommended due to potential weight gain which is already a struggle for patient.  Discussed alternative OCPs, patient declined - The possibility of starting an injectable medication for weight loss was discussed, and she expressed interest in trying Mounjaro once a week. - Will also check a lipid panel        Follow up yearly for annual physical or sooner if needed. Patient verbalized agreement to above plan.      The patient was informed  about the lab work obtained today. I will personally review any abnormal results, and if further evaluation or follow-up is needed, they will be notified directly through their patient portal or via phone (if they do not have a patient portal set up). The patient acknowledged and understands this.   This note was dictated with voice recognition software. Transcription error is likely and similar sounding words may be inadvertently transcribed incorrectly.   Richerd Ship, MD         [1] Past Medical History: Diagnosis Date  . Allergic   . Allergic rhinitis   . Arthritis   . Asthma   . Eczema   . Hashimoto's disease   . PCOS (polycystic ovarian syndrome)   . Sickle cell trait   . Spinal disease   [2] History reviewed. No pertinent surgical history. [3] Family History Problem Relation Name Age of Onset  . Clotting disorder Mother    . Hypothyroidism Mother    . Diabetes Mother    . Seizures Father    . Diabetes Father    . Heart disease Father    . No Known Problems Sister    [4] Social History Tobacco Use  . Smoking status: Former    Current packs/day: 0.25    Average packs/day: 0.3 packs/day for 4.0 years (1.0 ttl pk-yrs)    Types: Cigarettes  . Smokeless tobacco: Never  Vaping Use  . Vaping status: Never Used  Substance Use Topics  . Alcohol use: Not Currently    Alcohol/week: 2.0 standard drinks of alcohol    Types: 2 Glasses of wine per week  . Drug use: Never

## 2024-07-09 DIAGNOSIS — R399 Unspecified symptoms and signs involving the genitourinary system: Secondary | ICD-10-CM | POA: Diagnosis not present

## 2024-07-09 DIAGNOSIS — Z0000 Encounter for general adult medical examination without abnormal findings: Secondary | ICD-10-CM | POA: Diagnosis not present

## 2024-07-09 DIAGNOSIS — Z13220 Encounter for screening for lipoid disorders: Secondary | ICD-10-CM | POA: Diagnosis not present

## 2024-07-09 DIAGNOSIS — Z114 Encounter for screening for human immunodeficiency virus [HIV]: Secondary | ICD-10-CM | POA: Diagnosis not present

## 2024-07-16 ENCOUNTER — Ambulatory Visit (HOSPITAL_COMMUNITY)

## 2024-09-10 ENCOUNTER — Ambulatory Visit: Payer: Self-pay | Admitting: Family Medicine
